# Patient Record
Sex: Male | Born: 1950 | Race: White | Hispanic: No | Marital: Single | Smoking: Current every day smoker
Health system: Southern US, Community
[De-identification: ages and names within clinical notes are randomized; demographics above are authoritative.]

## PROBLEM LIST (undated history)

## (undated) DIAGNOSIS — I2510 Atherosclerotic heart disease of native coronary artery without angina pectoris: Secondary | ICD-10-CM

## (undated) DIAGNOSIS — I739 Peripheral vascular disease, unspecified: Secondary | ICD-10-CM

## (undated) DIAGNOSIS — G709 Myoneural disorder, unspecified: Secondary | ICD-10-CM

## (undated) DIAGNOSIS — F419 Anxiety disorder, unspecified: Secondary | ICD-10-CM

## (undated) DIAGNOSIS — K219 Gastro-esophageal reflux disease without esophagitis: Secondary | ICD-10-CM

## (undated) HISTORY — DX: Peripheral vascular disease, unspecified: I73.9

## (undated) HISTORY — PX: CORONARY ANGIOPLASTY WITH STENT PLACEMENT: SHX49

## (undated) HISTORY — PX: APPENDECTOMY: SHX54

---

## 2015-07-14 ENCOUNTER — Encounter: Payer: Self-pay | Admitting: Podiatry

## 2015-07-14 ENCOUNTER — Ambulatory Visit (INDEPENDENT_AMBULATORY_CARE_PROVIDER_SITE_OTHER): Payer: BC Managed Care – PPO | Admitting: Podiatry

## 2015-07-14 ENCOUNTER — Telehealth: Payer: Self-pay | Admitting: Podiatry

## 2015-07-14 VITALS — BP 149/83 | HR 89 | Resp 14

## 2015-07-14 DIAGNOSIS — R52 Pain, unspecified: Secondary | ICD-10-CM

## 2015-07-14 DIAGNOSIS — R202 Paresthesia of skin: Secondary | ICD-10-CM | POA: Diagnosis not present

## 2015-07-14 DIAGNOSIS — I739 Peripheral vascular disease, unspecified: Secondary | ICD-10-CM

## 2015-07-14 DIAGNOSIS — R200 Anesthesia of skin: Secondary | ICD-10-CM

## 2015-07-14 NOTE — Progress Notes (Signed)
   Subjective:    Patient ID: Alfred [place], male    DOB: 05-31-1950, 65 y.o.   MRN: [id number]  HPI this patient presents the office stating that he experiences numbness and pain in his feet and legs  while he is sleeping. He says that in the last 4 weeks. This is been waking him. He does admit to mild numbness in the morning in both feet with left greater than right foot. He experiences significant coldness in his feet frequently. He states this condition is worsening over time. He does admit to having heart surgery for which he had stents inserted in his heart. He presents the office for an evaluation of his feet to help explain the numbness through his feet and the sharp shooting pain through his lower leg. Finally, upon valuation. He was asked about claudication. He says he would have pain walking from this office over to the hospital, which is not a very far distance The patient presents here today with B/L feet that are numb and tingling with the left worse since 1 month.  Review of Systems  Musculoskeletal:       Muscle pains  All other systems reviewed and are negative.      Objective:   Physical Exam GENERAL APPEARANCE: Alert, conversant. Appropriately groomed. No acute distress.  VASCULAR: Pedal pulses are not   [facility]pable [facility].  Capillary refill time is 3 seconds.,  Cold feet noted but hair is present on dorsum of both feet.  . The toes are cold with purplish hue noted.  NEUROLOGIC: sensation is normal to 5.07 monofilament at 5/5 sites bilateral.  Light touch is intact bilateral, Muscle strength normal.  MUSCULOSKELETAL: acceptable muscle strength, tone and stability bilateral.  Intrinsic muscluature intact bilateral.  Rectus appearance of foot and digits noted bilateral.   DERMATOLOGIC: skin color, texture, and turgor are within normal limits.  No preulcerative lesions or ulcers  are seen, no interdigital maceration noted.  No open lesions present.  Digital nails are  asymptomatic. No drainage noted.         Assessment & Plan:  Numbness secondary to vascular  IE  Evaluation was performed and I blieve he needs to be examined by vascular specialist.  RTC after his vascular appointment  Prn   Helane GuntherGregory Trason Shifflet DPM

## 2015-07-15 ENCOUNTER — Telehealth: Payer: Self-pay | Admitting: *Deleted

## 2015-07-15 DIAGNOSIS — R0989 Other specified symptoms and signs involving the circulatory and respiratory systems: Secondary | ICD-10-CM

## 2015-07-15 NOTE — Telephone Encounter (Deleted)
-----   Message from Geoffry Paradiseheryl M Martin sent at 07/14/2015  3:38 PM EDT ----- Dr. Stacie AcresMayer referred this patient to a [place]ascular Sur[place] in [place].  I scheduled the appointment for Tues., July 22, 2015 at 2 pm with the patient to follow-up with Dr. Stacie AcresMayer after his appointment with the specialist.  He is seeing Terrilee FilesMichael Lininger, MD [address], [place], [place] [address] [phone].

## 2015-07-15 NOTE — Telephone Encounter (Signed)
Entered in error

## 2015-07-15 NOTE — Telephone Encounter (Addendum)
-----   Message from Geoffry Paradiseheryl M Martin sent at 07/14/2015  3:38 PM EDT ----- Dr. Stacie AcresMayer referred this patient to a [place]ascular Sur[place].  I scheduled the appointment for Tues., July 22, 2015 at 2 pm with the patient to follow-up with Dr. Stacie AcresMayer after his appointment with the specialist.  He is seeing Terrilee FilesMichael Lininger, MD [address], [place], [place] [address] [phone].  Fax[facility] medical records and [facility] [phone], fax [phone].

## 2015-08-06 ENCOUNTER — Other Ambulatory Visit: Payer: Self-pay

## 2015-08-06 DIAGNOSIS — I739 Peripheral vascular disease, unspecified: Secondary | ICD-10-CM

## 2015-08-07 ENCOUNTER — Encounter: Payer: Self-pay | Admitting: Vascular Surgery

## 2015-08-12 ENCOUNTER — Encounter: Payer: Self-pay | Admitting: Vascular Surgery

## 2015-08-12 ENCOUNTER — Ambulatory Visit (HOSPITAL_COMMUNITY)
Admission: RE | Admit: 2015-08-12 | Discharge: 2015-08-12 | Disposition: A | Discharge status: Home / Self Care | Payer: Medicare Other | Source: Ambulatory Visit | Attending: Vascular Surgery | Admitting: Vascular Surgery

## 2015-08-12 ENCOUNTER — Ambulatory Visit (INDEPENDENT_AMBULATORY_CARE_PROVIDER_SITE_OTHER): Payer: BC Managed Care – PPO | Admitting: Vascular Surgery

## 2015-08-12 ENCOUNTER — Other Ambulatory Visit: Payer: Self-pay

## 2015-08-12 VITALS — BP 148/80 | HR 70 | Temp 97.0°F | Resp 16 | Ht 68.0 in | Wt 172.0 lb

## 2015-08-12 DIAGNOSIS — I7409 Other arterial embolism and thrombosis of abdominal aorta: Secondary | ICD-10-CM

## 2015-08-12 DIAGNOSIS — I739 Peripheral vascular disease, unspecified: Secondary | ICD-10-CM | POA: Insufficient documentation

## 2015-08-12 DIAGNOSIS — R0989 Other specified symptoms and signs involving the circulatory and respiratory systems: Secondary | ICD-10-CM | POA: Diagnosis present

## 2015-08-12 NOTE — Progress Notes (Signed)
[facility] and [facility] Specialist of [place]  Patient name: Alfred [place] MRN: [id number] DOB: 1950-10-02 Sex: male  REASON FOR CONSULT: Patient seen today for severe lower from the arterial insufficiency. He is a very pleasant 65 year old gentleman has had progressive symptoms of pain and numbness in both lower extremities. He reports that crap calf cramping with minimal walking and also reports that he has been unable to sleep due to his left foot waking him up at night. He reports that he is able to walk a short distance have some relief going to back to bed view immediately awakened again with severe pain. Fortunately has had no tissue loss.  Not have any history of prior arterial treatment. No history of stroke. Does have a history of prior coronary stenting  Past Medical History  Diagnosis Date  . Peripheral arterial disease (HCC)     Family History  Problem Relation Age of Onset  . Heart disease Mother   . Heart disease Father     SOCIAL HISTORY: Social History   Social History  . Marital Status: Single    Spouse Name: N/A  . Number of Children: N/A  . Years of Education: N/A   Occupational History  . Not on file.   Social History Main Topics  . Smoking status: Current Every Day Smoker -- 1.00 packs/day    Types: Cigarettes  . Smokeless tobacco: Not on file  . Alcohol Use: No  . Drug Use: No  . Sexual Activity: Not on file   Other Topics Concern  . Not on file   Social History Narrative    Allergies  Allergen Reactions  . Codeine     Current Outpatient Prescriptions  Medication Sig Dispense Refill  . aspirin 81 MG tablet Take 81 mg by mouth daily.     No current facility-administered medications for this visit.    REVIEW OF SYSTEMS:  [X]  denotes positive finding, [ ]  denotes negative finding Cardiac  Comments:  Chest pain or chest pressure:    Shortness of breath upon exertion:    Short of breath when lying flat:    Irregular heart rhythm:      Vascular    Pain in calf, thigh, or hip brought on by ambulation: x   Pain in feet at night that wakes you up from your sleep:  x   Blood clot in your veins:    Leg swelling:         Pulmonary    Oxygen at home:    Productive cough:     Wheezing:         Neurologic    Sudden weakness in arms or legs:     Sudden numbness in arms or legs:     Sudden onset of difficulty speaking or slurred speech:    Temporary loss of vision in one eye:     Problems with dizziness:         Gastrointestinal    Blood in stool:     Vomited blood:         Genitourinary    Burning when urinating:     Blood in urine:        Psychiatric    Major depression:         Hematologic    Bleeding problems:    Problems with blood clotting too easily:        Skin    Rashes or ulcers:        Constitutional  Fever or chills:      PHYSICAL EXAM: Filed Vitals:   08/12/15 1102 08/12/15 1108  BP: 150/88 148/80  Pulse: 70 70  Temp: 97 F (36.1 C)   TempSrc: Oral   Resp: 16   Height: 5' 8" (1.727 m)   Weight: 172 lb (78.019 kg)   SpO2: 96%     GENERAL: The patient is a well-nourished male, in no acute distress. The vital signs are documented above. CARDIAC: There is a regular rate and rhythm.  VASCULAR: 2+ radial pulses bilaterally. Absent femoral popliteal and distal pulses bilaterally PULMONARY: There is good air exchange bilaterally without wheezing or rales. ABDOMEN: Soft and non-tender with normal pitched bowel sounds. No bruits noted MUSCULOSKELETAL: There are no major deformities or cyanosis. NEUROLOGIC: No focal weakness or paresthesias are detected. SKIN: There are no ulcers or rashes noted. Dependent rubor noted in his left foot. PSYCHIATRIC: The patient has a normal affect.  DATA:  Ankle arm index 0.35 on the right and 0.25 on the left  I did review his CT angiogram from [place] [facility] from 07/28/2015. This reveals narrowing and irregularity below the level the renals. 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 There is reconstitution of the common femoral arteries with runoff in the superficial femoral and profundus femoris artery. He does appear to have occlusion in his left superficial femoral artery as well.  MEDICAL ISSUES: Had long discussion with the patient and his girlfriend present. Explain the cause of his claudication and rest pain. Explain the treatment option of aortobifemoral bypass. Splane the magnitude of the procedure with expected 1-2 day intensive care stay and 5-7 day hospitalization. Also explain the prolonged recovery this magnitude of surgery. Will obtain cardiac evaluation prior to surgery and schedule surgery immediately following this. Explained that he is at risk for limb loss due to this level of ischemia.   Pearlee Arvizu [facility] [place] Beeper: [phone]      

## 2015-08-13 ENCOUNTER — Telehealth: Payer: Self-pay | Admitting: Vascular Surgery

## 2015-08-13 NOTE — Telephone Encounter (Signed)
Received fax of appointment with Dr. Dulce SellarMunley for cardiac clearance  - 08/20/15 8:30 with Dr. Rush BarerWaumeyer. Said pt is aware of appointment - kf

## 2015-08-25 ENCOUNTER — Encounter (HOSPITAL_COMMUNITY): Payer: Self-pay

## 2015-08-25 ENCOUNTER — Encounter (HOSPITAL_COMMUNITY)
Admission: RE | Admit: 2015-08-25 | Discharge: 2015-08-25 | Disposition: A | Discharge status: Home / Self Care | Payer: Medicare Other | Source: Ambulatory Visit | Attending: Vascular Surgery | Admitting: Vascular Surgery

## 2015-08-25 DIAGNOSIS — Z0183 Encounter for blood typing: Secondary | ICD-10-CM | POA: Insufficient documentation

## 2015-08-25 DIAGNOSIS — I2510 Atherosclerotic heart disease of native coronary artery without angina pectoris: Secondary | ICD-10-CM | POA: Diagnosis not present

## 2015-08-25 DIAGNOSIS — I739 Peripheral vascular disease, unspecified: Secondary | ICD-10-CM | POA: Diagnosis not present

## 2015-08-25 DIAGNOSIS — F17200 Nicotine dependence, unspecified, uncomplicated: Secondary | ICD-10-CM | POA: Diagnosis not present

## 2015-08-25 DIAGNOSIS — Z955 Presence of coronary angioplasty implant and graft: Secondary | ICD-10-CM | POA: Insufficient documentation

## 2015-08-25 DIAGNOSIS — K219 Gastro-esophageal reflux disease without esophagitis: Secondary | ICD-10-CM | POA: Diagnosis not present

## 2015-08-25 DIAGNOSIS — Z01812 Encounter for preprocedural laboratory examination: Secondary | ICD-10-CM | POA: Diagnosis not present

## 2015-08-25 DIAGNOSIS — Z7982 Long term (current) use of aspirin: Secondary | ICD-10-CM | POA: Diagnosis not present

## 2015-08-25 HISTORY — DX: Anxiety disorder, unspecified: F41.9

## 2015-08-25 HISTORY — DX: Myoneural disorder, unspecified: G70.9

## 2015-08-25 HISTORY — DX: Atherosclerotic heart disease of native coronary artery without angina pectoris: I25.10

## 2015-08-25 HISTORY — DX: Gastro-esophageal reflux disease without esophagitis: K21.9

## 2015-08-25 LAB — URINE MICROSCOPIC-ADD ON: Squamous Epithelial / LPF: NONE SEEN

## 2015-08-25 LAB — SURGICAL PCR SCREEN
MRSA, PCR: NEGATIVE
Staphylococcus aureus: NEGATIVE

## 2015-08-25 LAB — CBC
HEMATOCRIT: 49.3 % (ref 39.0–52.0)
HEMOGLOBIN: 16.8 g/dL (ref 13.0–17.0)
MCH: 31.5 pg (ref 26.0–34.0)
MCHC: 34.1 g/dL (ref 30.0–36.0)
MCV: 92.3 fL (ref 78.0–100.0)
PLATELETS: 179 10*3/uL (ref 150–400)
RBC: 5.34 MIL/uL (ref 4.22–5.81)
RDW: 12.4 % (ref 11.5–15.5)
WBC: 8.8 10*3/uL (ref 4.0–10.5)

## 2015-08-25 LAB — URINALYSIS, ROUTINE W REFLEX MICROSCOPIC
Bilirubin Urine: NEGATIVE
GLUCOSE, UA: 500 mg/dL — AB
Ketones, ur: NEGATIVE mg/dL
Leukocytes, UA: NEGATIVE
Nitrite: NEGATIVE
Protein, ur: NEGATIVE mg/dL
SPECIFIC GRAVITY, URINE: 1.021 (ref 1.005–1.030)
pH: 6 (ref 5.0–8.0)

## 2015-08-25 LAB — COMPREHENSIVE METABOLIC PANEL
ALK PHOS: 79 U/L (ref 38–126)
ALT: 23 U/L (ref 17–63)
ANION GAP: 9 (ref 5–15)
AST: 16 U/L (ref 15–41)
Albumin: 3.9 g/dL (ref 3.5–5.0)
BUN: 18 mg/dL (ref 6–20)
CALCIUM: 9.4 mg/dL (ref 8.9–10.3)
CO2: 23 mmol/L (ref 22–32)
Chloride: 106 mmol/L (ref 101–111)
Creatinine, Ser: 0.95 mg/dL (ref 0.61–1.24)
GFR calc non Af Amer: >60 mL/min (ref >60)
Glucose, Bld: 94 mg/dL (ref 65–99)
POTASSIUM: 4.1 mmol/L (ref 3.5–5.1)
SODIUM: 138 mmol/L (ref 135–145)
Total Bilirubin: 0.6 mg/dL (ref 0.3–1.2)
Total Protein: 6.6 g/dL (ref 6.5–8.1)

## 2015-08-25 LAB — TYPE AND SCREEN
ABO/RH(D): B NEG
ANTIBODY SCREEN: NEGATIVE

## 2015-08-25 LAB — PROTIME-INR
INR: 0.95 (ref 0.00–1.49)
Prothrombin Time: 12.9 seconds (ref 11.6–15.2)

## 2015-08-25 LAB — APTT: APTT: 24 s (ref 24–37)

## 2015-08-25 LAB — ABO/RH: ABO/RH(D): B NEG

## 2015-08-25 NOTE — Progress Notes (Signed)
Requested last office visit and any recent cardiac studies from [facility] in [place].

## 2015-08-25 NOTE — Progress Notes (Signed)
To have Stress Test done on 08-28-2015 At [facility] [place].

## 2015-08-25 NOTE — Pre-Procedure Instructions (Signed)
    Carroll SageRandy Piccininni  08/25/2015      [id number], [place] - [address] AT [facility] [address] [place] [address] Phone: [phone] Fax: [phone]    Your procedure is scheduled on May  22,2017   Monday .  [facility] Admitting at 5:30 A.M.   Call this number if you have problems the morning of surgery:  [phone]   Remember:  Do not eat food or drink liquids after midnight.   Take these medicines the morning of surgery with A SIP OF WATER none   Do not wear jewelry,  Do not wear lotions, powders, or perfumes.  You may NOT wear deodorant.   Do not shave 48 hours prior to surgery.  Men may shave face and neck.   Do not bring valuables to the hospital.  [facility] is not responsible for any belongings or valuables.  Contacts, dentures or bridgework may not be worn into surgery.  Leave your suitcase in the car.  After surgery it may be brought to your room.  For patients admitted to the hospital, discharge time will be determined by your treatment team.  Patients discharged the day of surgery will not be allowed to drive home.     Special instructions:  See Attached Sheet for instructions on CHG Shower  Please read over the following fact sheets that you were given. Pain Booklet, Coughing and Deep Breathing and Blood Transfusion Information

## 2015-08-25 NOTE — Progress Notes (Addendum)
Respiratory states that they did not receive the blood gases on pt. Which were drawn @ 1330. Will need to redraw DOS. Possibility Blood gases never drawn,every one from lab has left for the day,unable to check with them.

## 2015-08-26 NOTE — Progress Notes (Addendum)
Anesthesia Chart Review:  Pt is a 65 year old male scheduled for aortobifemoral bypass graft on 09/01/2015 with Dr. Arbie Holmes.   PMH includes:  CAD (3 stents), PAD, GERD. Current smoker. BMI 27  Medications include: ASA, recently started on pravastatin.   Preoperative labs reviewed.  Blood gas will be obtained DOS.   EKG 08/20/15: requested from [facility] in [place].   Pt saw Dr. Wille Holmes for pre-op eval.  Pt is scheduled for stress test 08/28/15. Will revisit chart when results available.   Alfred Mastngela Alexiah Koroma, FNP-BC [facility]/Anesthesiology Phone: [phone] 08/26/2015 2:01 PM  Addendum:  Nuclear stress test 08/28/15: negative study.   If no changes, I anticipate pt can proceed with surgery as scheduled.   Alfred Mastngela Osamah Schmader, FNP-BC [facility]/Anesthesiology Phone: [phone] 08/29/2015 4:14 PM

## 2015-08-29 ENCOUNTER — Encounter: Payer: Self-pay | Admitting: Vascular Surgery

## 2015-08-31 MED ORDER — DEXTROSE 5 % IV SOLN
1.5000 g | INTRAVENOUS | Status: AC
  Administered 2015-09-01 (×2): 1.5 g via INTRAVENOUS
  Filled 2015-08-31: qty 1.5

## 2015-08-31 MED ORDER — SODIUM CHLORIDE 0.9 % IV SOLN
INTRAVENOUS | Status: DC
  Administered 2015-09-01: 12:00:00 via INTRAVENOUS

## 2015-09-01 ENCOUNTER — Inpatient Hospital Stay (HOSPITAL_COMMUNITY): Payer: Medicare Other

## 2015-09-01 ENCOUNTER — Inpatient Hospital Stay (HOSPITAL_COMMUNITY)
Admission: RE | Admit: 2015-09-01 | Discharge: 2015-09-08 | DRG: 269 | Disposition: A | Discharge status: Home Health | Payer: Medicare Other | Source: Ambulatory Visit | Attending: Vascular Surgery | Admitting: Vascular Surgery

## 2015-09-01 ENCOUNTER — Encounter (HOSPITAL_COMMUNITY)
Admission: RE | Disposition: A | Discharge status: Home Health | Payer: Self-pay | Source: Ambulatory Visit | Attending: Vascular Surgery

## 2015-09-01 ENCOUNTER — Encounter (HOSPITAL_COMMUNITY): Payer: Self-pay | Admitting: *Deleted

## 2015-09-01 ENCOUNTER — Inpatient Hospital Stay (HOSPITAL_COMMUNITY): Payer: Medicare Other | Admitting: Emergency Medicine

## 2015-09-01 ENCOUNTER — Inpatient Hospital Stay (HOSPITAL_COMMUNITY): Payer: Medicare Other | Admitting: Certified Registered Nurse Anesthetist

## 2015-09-01 DIAGNOSIS — F17210 Nicotine dependence, cigarettes, uncomplicated: Secondary | ICD-10-CM | POA: Diagnosis present

## 2015-09-01 DIAGNOSIS — Z7982 Long term (current) use of aspirin: Secondary | ICD-10-CM | POA: Diagnosis not present

## 2015-09-01 DIAGNOSIS — K219 Gastro-esophageal reflux disease without esophagitis: Secondary | ICD-10-CM | POA: Diagnosis present

## 2015-09-01 DIAGNOSIS — I10 Essential (primary) hypertension: Secondary | ICD-10-CM | POA: Diagnosis present

## 2015-09-01 DIAGNOSIS — I771 Stricture of artery: Principal | ICD-10-CM | POA: Diagnosis present

## 2015-09-01 DIAGNOSIS — I252 Old myocardial infarction: Secondary | ICD-10-CM | POA: Diagnosis not present

## 2015-09-01 DIAGNOSIS — Z955 Presence of coronary angioplasty implant and graft: Secondary | ICD-10-CM | POA: Diagnosis not present

## 2015-09-01 DIAGNOSIS — I7092 Chronic total occlusion of artery of the extremities: Secondary | ICD-10-CM | POA: Diagnosis present

## 2015-09-01 DIAGNOSIS — I700 Atherosclerosis of aorta: Secondary | ICD-10-CM | POA: Diagnosis present

## 2015-09-01 DIAGNOSIS — I739 Peripheral vascular disease, unspecified: Secondary | ICD-10-CM | POA: Diagnosis present

## 2015-09-01 DIAGNOSIS — F419 Anxiety disorder, unspecified: Secondary | ICD-10-CM | POA: Diagnosis present

## 2015-09-01 DIAGNOSIS — I998 Other disorder of circulatory system: Secondary | ICD-10-CM

## 2015-09-01 DIAGNOSIS — I7409 Other arterial embolism and thrombosis of abdominal aorta: Secondary | ICD-10-CM | POA: Diagnosis present

## 2015-09-01 DIAGNOSIS — Z95828 Presence of other vascular implants and grafts: Secondary | ICD-10-CM

## 2015-09-01 DIAGNOSIS — R000 Tachycardia, unspecified: Secondary | ICD-10-CM | POA: Diagnosis not present

## 2015-09-01 DIAGNOSIS — Z885 Allergy status to narcotic agent status: Secondary | ICD-10-CM | POA: Diagnosis not present

## 2015-09-01 DIAGNOSIS — Z8249 Family history of ischemic heart disease and other diseases of the circulatory system: Secondary | ICD-10-CM | POA: Diagnosis not present

## 2015-09-01 DIAGNOSIS — K429 Umbilical hernia without obstruction or gangrene: Secondary | ICD-10-CM | POA: Diagnosis present

## 2015-09-01 DIAGNOSIS — R200 Anesthesia of skin: Secondary | ICD-10-CM | POA: Diagnosis present

## 2015-09-01 HISTORY — PX: AORTA - BILATERAL FEMORAL ARTERY BYPASS GRAFT: SHX1175

## 2015-09-01 LAB — BLOOD GAS, ARTERIAL
Acid-base deficit: 1.3 mmol/L (ref 0.0–2.0)
BICARBONATE: 23.4 meq/L (ref 20.0–24.0)
O2 Content: 4 L/min
O2 SAT: 89.6 %
PATIENT TEMPERATURE: 98.1
PCO2 ART: 42.1 mmHg (ref 35.0–45.0)
PO2 ART: 58.7 mmHg — AB (ref 80.0–100.0)
TCO2: 24.7 mmol/L (ref 0–100)
pH, Arterial: 7.362 (ref 7.350–7.450)

## 2015-09-01 LAB — APTT: APTT: 23 s — AB (ref 24–37)

## 2015-09-01 LAB — BASIC METABOLIC PANEL
ANION GAP: 8 (ref 5–15)
BUN: 10 mg/dL (ref 6–20)
CALCIUM: 8.5 mg/dL — AB (ref 8.9–10.3)
CO2: 24 mmol/L (ref 22–32)
Chloride: 105 mmol/L (ref 101–111)
Creatinine, Ser: 0.94 mg/dL (ref 0.61–1.24)
GFR calc Af Amer: >60 mL/min (ref >60)
Glucose, Bld: 210 mg/dL — ABNORMAL HIGH (ref 65–99)
POTASSIUM: 3.6 mmol/L (ref 3.5–5.1)
SODIUM: 137 mmol/L (ref 135–145)

## 2015-09-01 LAB — POCT I-STAT 7, (LYTES, BLD GAS, ICA,H+H)
Acid-base deficit: 2 mmol/L (ref 0.0–2.0)
BICARBONATE: 22.9 meq/L (ref 20.0–24.0)
CALCIUM ION: 1.15 mmol/L (ref 1.13–1.30)
HCT: 46 % (ref 39.0–52.0)
Hemoglobin: 15.6 g/dL (ref 13.0–17.0)
O2 Saturation: 97 %
PCO2 ART: 39.9 mmHg (ref 35.0–45.0)
PO2 ART: 97 mmHg (ref 80.0–100.0)
Potassium: 3.4 mmol/L — ABNORMAL LOW (ref 3.5–5.1)
Sodium: 142 mmol/L (ref 135–145)
TCO2: 24 mmol/L (ref 0–100)
pH, Arterial: 7.368 (ref 7.350–7.450)

## 2015-09-01 LAB — PROTIME-INR
INR: 1.18 (ref 0.00–1.49)
Prothrombin Time: 15.1 seconds (ref 11.6–15.2)

## 2015-09-01 LAB — CBC
HCT: 40.7 % (ref 39.0–52.0)
Hemoglobin: 13.8 g/dL (ref 13.0–17.0)
MCH: 31.3 pg (ref 26.0–34.0)
MCHC: 33.9 g/dL (ref 30.0–36.0)
MCV: 92.3 fL (ref 78.0–100.0)
PLATELETS: 191 10*3/uL (ref 150–400)
RBC: 4.41 MIL/uL (ref 4.22–5.81)
RDW: 12.4 % (ref 11.5–15.5)
WBC: 19.5 10*3/uL — AB (ref 4.0–10.5)

## 2015-09-01 LAB — GLUCOSE, CAPILLARY
GLUCOSE-CAPILLARY: 192 mg/dL — AB (ref 65–99)
GLUCOSE-CAPILLARY: 207 mg/dL — AB (ref 65–99)
GLUCOSE-CAPILLARY: 236 mg/dL — AB (ref 65–99)

## 2015-09-01 LAB — CREATININE, SERUM
CREATININE: 0.79 mg/dL (ref 0.61–1.24)
GFR calc Af Amer: >60 mL/min (ref >60)
GFR calc non Af Amer: >60 mL/min (ref >60)

## 2015-09-01 LAB — MAGNESIUM: MAGNESIUM: 1.3 mg/dL — AB (ref 1.7–2.4)

## 2015-09-01 SURGERY — CREATION, BYPASS, ARTERIAL, AORTA TO FEMORAL, BILATERAL, USING GRAFT
Anesthesia: General | Site: Abdomen | Laterality: Bilateral

## 2015-09-01 MED ORDER — HYDRALAZINE HCL 20 MG/ML IJ SOLN
5.0000 mg | INTRAMUSCULAR | Status: AC | PRN
  Administered 2015-09-01 (×2): 5 mg via INTRAVENOUS
  Filled 2015-09-01 (×2): qty 1

## 2015-09-01 MED ORDER — PROTAMINE SULFATE 10 MG/ML IV SOLN
INTRAVENOUS | Status: DC | PRN
  Administered 2015-09-01: 10 mg via INTRAVENOUS
  Administered 2015-09-01 (×2): 15 mg via INTRAVENOUS
  Administered 2015-09-01: 10 mg via INTRAVENOUS

## 2015-09-01 MED ORDER — SUGAMMADEX SODIUM 200 MG/2ML IV SOLN
INTRAVENOUS | Status: DC | PRN
  Administered 2015-09-01: 200 mg via INTRAVENOUS

## 2015-09-01 MED ORDER — CEFUROXIME SODIUM 1.5 G IJ SOLR
1.5000 g | Freq: Two times a day (BID) | INTRAMUSCULAR | Status: AC
  Administered 2015-09-02 (×2): 1.5 g via INTRAVENOUS
  Filled 2015-09-01 (×2): qty 1.5

## 2015-09-01 MED ORDER — POTASSIUM CHLORIDE CRYS ER 20 MEQ PO TBCR: 20.0000 meq | EXTENDED_RELEASE_TABLET | Freq: Every day | ORAL | Status: DC | PRN

## 2015-09-01 MED ORDER — ALBUMIN HUMAN 5 % IV SOLN
INTRAVENOUS | Status: DC | PRN
  Administered 2015-09-01 (×2): via INTRAVENOUS

## 2015-09-01 MED ORDER — ALUM & MAG HYDROXIDE-SIMETH 200-200-20 MG/5ML PO SUSP: 15.0000 mL | ORAL | Status: DC | PRN

## 2015-09-01 MED ORDER — LACTATED RINGERS IV SOLN
INTRAVENOUS | Status: DC | PRN
  Administered 2015-09-01 (×3): via INTRAVENOUS

## 2015-09-01 MED ORDER — PHENYLEPHRINE HCL 10 MG/ML IJ SOLN
10.0000 mg | INTRAVENOUS | Status: DC | PRN
  Administered 2015-09-01: 40 ug/min via INTRAVENOUS

## 2015-09-01 MED ORDER — MIDAZOLAM HCL 2 MG/2ML IJ SOLN
INTRAMUSCULAR | Status: AC
  Filled 2015-09-01: qty 2

## 2015-09-01 MED ORDER — ROCURONIUM BROMIDE 50 MG/5ML IV SOLN
INTRAVENOUS | Status: AC
  Filled 2015-09-01: qty 3

## 2015-09-01 MED ORDER — MANNITOL 25 % IV SOLN
25.0000 g | INTRAVENOUS | Status: DC
  Filled 2015-09-01: qty 100

## 2015-09-01 MED ORDER — PHENOL 1.4 % MT LIQD: 1.0000 | OROMUCOSAL | Status: DC | PRN

## 2015-09-01 MED ORDER — CHLORHEXIDINE GLUCONATE CLOTH 2 % EX PADS: 6.0000 | MEDICATED_PAD | Freq: Once | CUTANEOUS | Status: DC

## 2015-09-01 MED ORDER — METOPROLOL TARTRATE 5 MG/5ML IV SOLN: 2.0000 mg | INTRAVENOUS | Status: DC | PRN

## 2015-09-01 MED ORDER — DOCUSATE SODIUM 100 MG PO CAPS
100.0000 mg | ORAL_CAPSULE | Freq: Every day | ORAL | Status: DC
  Administered 2015-09-04 – 2015-09-07 (×3): 100 mg via ORAL
  Filled 2015-09-01 (×5): qty 1

## 2015-09-01 MED ORDER — MANNITOL 25 % IV SOLN
INTRAVENOUS | Status: DC | PRN
  Administered 2015-09-01 (×2): 12.5 g via INTRAVENOUS

## 2015-09-01 MED ORDER — INSULIN ASPART 100 UNIT/ML ~~LOC~~ SOLN
0.0000 [IU] | SUBCUTANEOUS | Status: DC
  Administered 2015-09-01 (×2): 3 [IU] via SUBCUTANEOUS
  Administered 2015-09-02: 2 [IU] via SUBCUTANEOUS
  Administered 2015-09-02 (×4): 1 [IU] via SUBCUTANEOUS
  Administered 2015-09-03 (×2): 2 [IU] via SUBCUTANEOUS
  Administered 2015-09-03 – 2015-09-04 (×6): 1 [IU] via SUBCUTANEOUS
  Administered 2015-09-04: 2 [IU] via SUBCUTANEOUS
  Administered 2015-09-04 – 2015-09-08 (×8): 1 [IU] via SUBCUTANEOUS

## 2015-09-01 MED ORDER — GUAIFENESIN-DM 100-10 MG/5ML PO SYRP
15.0000 mL | ORAL_SOLUTION | ORAL | Status: DC | PRN
  Administered 2015-09-05 – 2015-09-06 (×2): 15 mL via ORAL
  Filled 2015-09-01 (×3): qty 15

## 2015-09-01 MED ORDER — HEPARIN SODIUM (PORCINE) 1000 UNIT/ML IJ SOLN
INTRAMUSCULAR | Status: DC | PRN
  Administered 2015-09-01: 8 mL via INTRAVENOUS

## 2015-09-01 MED ORDER — FENTANYL CITRATE (PF) 250 MCG/5ML IJ SOLN
INTRAMUSCULAR | Status: AC
  Filled 2015-09-01: qty 5

## 2015-09-01 MED ORDER — SODIUM CHLORIDE 0.9 % IV SOLN
INTRAVENOUS | Status: DC | PRN
  Administered 2015-09-01: 500 mL

## 2015-09-01 MED ORDER — EPHEDRINE 5 MG/ML INJ
INTRAVENOUS | Status: AC
  Filled 2015-09-01: qty 10

## 2015-09-01 MED ORDER — PROTAMINE SULFATE 10 MG/ML IV SOLN
INTRAVENOUS | Status: AC
  Filled 2015-09-01: qty 5

## 2015-09-01 MED ORDER — PANTOPRAZOLE SODIUM 40 MG IV SOLR
40.0000 mg | Freq: Every day | INTRAVENOUS | Status: DC
  Administered 2015-09-01 – 2015-09-04 (×4): 40 mg via INTRAVENOUS
  Filled 2015-09-01 (×3): qty 40

## 2015-09-01 MED ORDER — 0.9 % SODIUM CHLORIDE (POUR BTL) OPTIME
TOPICAL | Status: DC | PRN
  Administered 2015-09-01: 3000 mL

## 2015-09-01 MED ORDER — KCL IN DEXTROSE-NACL 20-5-0.45 MEQ/L-%-% IV SOLN
INTRAVENOUS | Status: DC
  Administered 2015-09-01: 15:00:00 via INTRAVENOUS
  Administered 2015-09-02: 125 mL via INTRAVENOUS
  Administered 2015-09-02 – 2015-09-06 (×5): via INTRAVENOUS
  Filled 2015-09-01 (×16): qty 1000

## 2015-09-01 MED ORDER — FENTANYL CITRATE (PF) 100 MCG/2ML IJ SOLN
INTRAMUSCULAR | Status: DC | PRN
  Administered 2015-09-01: 50 ug via INTRAVENOUS
  Administered 2015-09-01: 150 ug via INTRAVENOUS
  Administered 2015-09-01 (×4): 50 ug via INTRAVENOUS
  Administered 2015-09-01: 250 ug via INTRAVENOUS
  Administered 2015-09-01 (×5): 50 ug via INTRAVENOUS

## 2015-09-01 MED ORDER — LACTATED RINGERS IV SOLN
INTRAVENOUS | Status: DC | PRN
  Administered 2015-09-01: 07:00:00 via INTRAVENOUS

## 2015-09-01 MED ORDER — SODIUM CHLORIDE 0.9 % IV SOLN: 500.0000 mL | Freq: Once | INTRAVENOUS | Status: DC | PRN

## 2015-09-01 MED ORDER — ROCURONIUM BROMIDE 100 MG/10ML IV SOLN
INTRAVENOUS | Status: DC | PRN
  Administered 2015-09-01: 50 mg via INTRAVENOUS
  Administered 2015-09-01: 20 mg via INTRAVENOUS
  Administered 2015-09-01: 5 mg via INTRAVENOUS
  Administered 2015-09-01: 20 mg via INTRAVENOUS
  Administered 2015-09-01: 10 mg via INTRAVENOUS

## 2015-09-01 MED ORDER — PROPOFOL 10 MG/ML IV BOLUS
INTRAVENOUS | Status: DC | PRN
  Administered 2015-09-01: 80 mg via INTRAVENOUS

## 2015-09-01 MED ORDER — ACETAMINOPHEN 325 MG PO TABS
325.0000 mg | ORAL_TABLET | ORAL | Status: DC | PRN
  Administered 2015-09-06 – 2015-09-08 (×6): 650 mg via ORAL
  Filled 2015-09-01 (×7): qty 2

## 2015-09-01 MED ORDER — ONDANSETRON HCL 4 MG/2ML IJ SOLN
INTRAMUSCULAR | Status: DC | PRN
  Administered 2015-09-01: 4 mg via INTRAVENOUS

## 2015-09-01 MED ORDER — LIDOCAINE HCL (CARDIAC) 20 MG/ML IV SOLN
INTRAVENOUS | Status: DC | PRN
  Administered 2015-09-01: 100 mg via INTRAVENOUS

## 2015-09-01 MED ORDER — BISACODYL 10 MG RE SUPP
10.0000 mg | Freq: Every day | RECTAL | Status: DC | PRN
  Administered 2015-09-04: 10 mg via RECTAL
  Filled 2015-09-01 (×2): qty 1

## 2015-09-01 MED ORDER — HYDROMORPHONE HCL 1 MG/ML IJ SOLN
0.2500 mg | INTRAMUSCULAR | Status: DC | PRN
  Administered 2015-09-01 (×2): 0.5 mg via INTRAVENOUS

## 2015-09-01 MED ORDER — LIDOCAINE 2% (20 MG/ML) 5 ML SYRINGE
INTRAMUSCULAR | Status: AC
  Filled 2015-09-01: qty 5

## 2015-09-01 MED ORDER — LABETALOL HCL 5 MG/ML IV SOLN
10.0000 mg | INTRAVENOUS | Status: DC | PRN
  Administered 2015-09-01 – 2015-09-04 (×2): 10 mg via INTRAVENOUS
  Filled 2015-09-01 (×2): qty 4

## 2015-09-01 MED ORDER — MEPERIDINE HCL 25 MG/ML IJ SOLN: 6.2500 mg | INTRAMUSCULAR | Status: DC | PRN

## 2015-09-01 MED ORDER — ONDANSETRON HCL 4 MG/2ML IJ SOLN
4.0000 mg | Freq: Four times a day (QID) | INTRAMUSCULAR | Status: DC | PRN
Start: 2015-09-01 — End: 2015-09-02
  Administered 2015-09-02: 4 mg via INTRAVENOUS
  Filled 2015-09-01: qty 2

## 2015-09-01 MED ORDER — MORPHINE SULFATE (PF) 2 MG/ML IV SOLN
2.0000 mg | INTRAVENOUS | Status: DC | PRN
  Administered 2015-09-01 (×3): 4 mg via INTRAVENOUS
  Administered 2015-09-01: 2 mg via INTRAVENOUS
  Administered 2015-09-01 – 2015-09-02 (×7): 4 mg via INTRAVENOUS
  Filled 2015-09-01 (×9): qty 2
  Filled 2015-09-01: qty 1
  Filled 2015-09-01: qty 2

## 2015-09-01 MED ORDER — ENOXAPARIN SODIUM 40 MG/0.4ML ~~LOC~~ SOLN
40.0000 mg | SUBCUTANEOUS | Status: DC
  Administered 2015-09-02 – 2015-09-07 (×6): 40 mg via SUBCUTANEOUS
  Filled 2015-09-01 (×6): qty 0.4

## 2015-09-01 MED ORDER — HYDROMORPHONE HCL 1 MG/ML IJ SOLN
INTRAMUSCULAR | Status: AC
  Filled 2015-09-01: qty 1

## 2015-09-01 MED ORDER — MIDAZOLAM HCL 5 MG/5ML IJ SOLN
INTRAMUSCULAR | Status: DC | PRN
  Administered 2015-09-01: 2 mg via INTRAVENOUS

## 2015-09-01 MED ORDER — ONDANSETRON HCL 4 MG/2ML IJ SOLN
INTRAMUSCULAR | Status: AC
  Filled 2015-09-01: qty 2

## 2015-09-01 MED ORDER — MAGNESIUM SULFATE 2 GM/50ML IV SOLN
2.0000 g | Freq: Every day | INTRAVENOUS | Status: AC | PRN
  Administered 2015-09-01: 2 g via INTRAVENOUS
  Filled 2015-09-01: qty 50

## 2015-09-01 MED ORDER — ONDANSETRON HCL 4 MG/2ML IJ SOLN: 4.0000 mg | Freq: Once | INTRAMUSCULAR | Status: DC | PRN

## 2015-09-01 MED ORDER — ACETAMINOPHEN 325 MG RE SUPP: 325.0000 mg | RECTAL | Status: DC | PRN

## 2015-09-01 MED ORDER — PROPOFOL 10 MG/ML IV BOLUS
INTRAVENOUS | Status: AC
  Filled 2015-09-01: qty 20

## 2015-09-01 MED ORDER — DEXTROSE 5 % IV SOLN
1.5000 g | INTRAVENOUS | Status: DC
  Filled 2015-09-01: qty 1.5

## 2015-09-01 SURGICAL SUPPLY — 59 items
BENZOIN TINCTURE PRP APPL 2/3 (GAUZE/BANDAGES/DRESSINGS) ×9 IMPLANT
CANISTER SUCTION 2500CC (MISCELLANEOUS) ×3 IMPLANT
CANNULA VESSEL 3MM 2 BLNT TIP (CANNULA) ×6 IMPLANT
CLIP LIGATING EXTRA MED SLVR (CLIP) ×3 IMPLANT
CLIP LIGATING EXTRA SM BLUE (MISCELLANEOUS) ×3 IMPLANT
CLOSURE STERI-STRIP 1/2X4 (GAUZE/BANDAGES/DRESSINGS) ×3
CLOSURE WOUND 1/2 X4 (GAUZE/BANDAGES/DRESSINGS) ×2
CLSR STERI-STRIP ANTIMIC 1/2X4 (GAUZE/BANDAGES/DRESSINGS) ×6 IMPLANT
COVER TABLE BACK 60X90 (DRAPES) ×3 IMPLANT
DRAPE BILATERAL SPLIT (DRAPES) IMPLANT
DRAPE CV SPLIT W-CLR ANES SCRN (DRAPES) IMPLANT
DRSG COVADERM 4X14 (GAUZE/BANDAGES/DRESSINGS) ×3 IMPLANT
DRSG COVADERM 4X6 (GAUZE/BANDAGES/DRESSINGS) ×6 IMPLANT
ELECT BLADE 4.0 EZ CLEAN MEGAD (MISCELLANEOUS) ×3
ELECT REM PT RETURN 9FT ADLT (ELECTROSURGICAL) ×3
ELECTRODE BLDE 4.0 EZ CLN MEGD (MISCELLANEOUS) ×1 IMPLANT
ELECTRODE REM PT RTRN 9FT ADLT (ELECTROSURGICAL) ×1 IMPLANT
FELT TEFLON 1X6 (MISCELLANEOUS) ×3 IMPLANT
GLOVE BIO SURGEON STRL SZ 6.5 (GLOVE) ×4 IMPLANT
GLOVE BIO SURGEONS STRL SZ 6.5 (GLOVE) ×2
GLOVE BIOGEL PI IND STRL 6.5 (GLOVE) ×5 IMPLANT
GLOVE BIOGEL PI INDICATOR 6.5 (GLOVE) ×10
GLOVE ECLIPSE 6.5 STRL STRAW (GLOVE) ×9 IMPLANT
GLOVE SS BIOGEL STRL SZ 7.5 (GLOVE) ×1 IMPLANT
GLOVE SUPERSENSE BIOGEL SZ 7.5 (GLOVE) ×2
GOWN STRL REUS W/ TWL LRG LVL3 (GOWN DISPOSABLE) ×3 IMPLANT
GOWN STRL REUS W/TWL LRG LVL3 (GOWN DISPOSABLE) ×6
GRAFT HEMASHIELD 14X8MM (Vascular Products) ×3 IMPLANT
INSERT FOGARTY 61MM (MISCELLANEOUS) ×3 IMPLANT
INSERT FOGARTY SM (MISCELLANEOUS) ×6 IMPLANT
KIT BASIN OR (CUSTOM PROCEDURE TRAY) ×3 IMPLANT
KIT ROOM TURNOVER OR (KITS) ×3 IMPLANT
NS IRRIG 1000ML POUR BTL (IV SOLUTION) ×6 IMPLANT
PACK AORTA (CUSTOM PROCEDURE TRAY) ×3 IMPLANT
PAD ARMBOARD 7.5X6 YLW CONV (MISCELLANEOUS) ×6 IMPLANT
SPONGE LAP 18X18 X RAY DECT (DISPOSABLE) IMPLANT
STAPLER VISISTAT 35W (STAPLE) ×6 IMPLANT
STRIP CLOSURE SKIN 1/2X4 (GAUZE/BANDAGES/DRESSINGS) ×4 IMPLANT
SUT ETHIBOND 5 LR DA (SUTURE) IMPLANT
SUT PDS AB 1 TP1 54 (SUTURE) ×6 IMPLANT
SUT PROLENE 3 0 SH1 36 (SUTURE) ×9 IMPLANT
SUT PROLENE 5 0 C 1 24 (SUTURE) ×6 IMPLANT
SUT PROLENE 5 0 C 1 36 (SUTURE) IMPLANT
SUT PROLENE 6 0 CC (SUTURE) ×9 IMPLANT
SUT SILK 2 0 (SUTURE) ×2
SUT SILK 2 0 SH CR/8 (SUTURE) ×3 IMPLANT
SUT SILK 2 0 TIES 17X18 (SUTURE) ×2
SUT SILK 2-0 18XBRD TIE 12 (SUTURE) ×1 IMPLANT
SUT SILK 2-0 18XBRD TIE BLK (SUTURE) ×1 IMPLANT
SUT SILK 3 0 (SUTURE) ×2
SUT SILK 3 0 TIES 17X18 (SUTURE) ×2
SUT SILK 3-0 18XBRD TIE 12 (SUTURE) ×1 IMPLANT
SUT SILK 3-0 18XBRD TIE BLK (SUTURE) ×1 IMPLANT
SUT VIC AB 2-0 CT1 36 (SUTURE) ×9 IMPLANT
SUT VIC AB 3-0 SH 27 (SUTURE) ×8
SUT VIC AB 3-0 SH 27X BRD (SUTURE) ×4 IMPLANT
TOWEL BLUE STERILE X RAY DET (MISCELLANEOUS) ×6 IMPLANT
TRAY FOLEY W/METER SILVER 16FR (SET/KITS/TRAYS/PACK) ×3 IMPLANT
WATER STERILE IRR 1000ML POUR (IV SOLUTION) ×3 IMPLANT

## 2015-09-01 NOTE — Progress Notes (Addendum)
  [facility]in Specialists Progress Note  S/p aortobifemoral bypass, day of surgery   Reporting left foot numbness which is comparable to pre-op.   Audible doppler flow left PT, foot is warm and pink. Palpable right PT pulse. Abdomen mildly distended, but soft. Per nursing staff, laparotomy dressing reinforced due to bleeding. Bandage is clean upon inspection. Right groin dressing with moderate drainage. Left dressing clean.   Stable post-op. Hypertension now stable. Good UOP.    Maris BergerKimberly Trinh, PA-C [facility] Office: [phone] Pager: [phone] 09/01/2015 3:54 PM     I have examined the patient, reviewed and agree with above.  Gretta BeganEarly, Zak Gondek, MD 09/01/2015 4:22 PM

## 2015-09-01 NOTE — Progress Notes (Signed)
Patient complaining of numbness to LLE. Dr Early notified and okay with patient being transported out of PACU and to 2s16. Will see patient this afternoon in his room.

## 2015-09-01 NOTE — Anesthesia Procedure Notes (Signed)
Procedure Name: Intubation Date/Time: 09/01/2015 7:51 AM Performed by: Rogelia BogaMUELLER, Zeppelin Commisso P Pre-anesthesia Checklist: Patient identified, Emergency Drugs available, Suction available, Patient being monitored and Timeout performed Patient Re-evaluated:Patient Re-evaluated prior to inductionOxygen Delivery Method: Circle system utilized Preoxygenation: Pre-oxygenation with 100% oxygen Intubation Type: IV induction Ventilation: Mask ventilation without difficulty Laryngoscope Size: Mac and 4 Grade View: Grade II Tube type: Oral Tube size: 7.5 mm Number of attempts: 1 Airway Equipment and Method: Stylet Placement Confirmation: ETT inserted through vocal cords under direct vision Secured at: 22 cm Tube secured with: Tape Dental Injury: Teeth and Oropharynx as per pre-operative assessment

## 2015-09-01 NOTE — Anesthesia Postprocedure Evaluation (Signed)
Anesthesia Post Note  Patient: Alfred Holmes  Procedure(s) Performed: Procedure(s) (LRB): AORTOBIFEMORAL BYPASS GRAFT (Bilateral)  Patient location during evaluation: PACU Anesthesia Type: General Level of consciousness: awake and alert Pain management: pain level controlled Vital Signs Assessment: post-procedure vital signs reviewed and stable Respiratory status: spontaneous breathing, nonlabored ventilation, respiratory function stable and patient connected to nasal cannula oxygen Cardiovascular status: blood pressure returned to baseline and stable Postop Assessment: no signs of nausea or vomiting Anesthetic complications: no    Last Vitals:  Filed Vitals:   09/01/15 1355 09/01/15 1405  BP: 130/69 141/73  Pulse: 87 88  Temp:  36.4 C  Resp: 24 26    Last Pain:  Filed Vitals:   09/01/15 1406  PainSc: Asleep                 Dorris Pierre DAVID

## 2015-09-01 NOTE — Anesthesia Preprocedure Evaluation (Addendum)
Anesthesia Evaluation  Patient identified by MRN, date of birth, ID band Patient awake    Reviewed: NPO status , Patient's Chart, lab work & pertinent test results  Airway Mallampati: II  TM Distance: >3 FB Neck ROM: Full    Dental  (+) Teeth Intact, Dental Advisory Given   Pulmonary Current Smoker,    Pulmonary exam normal        Cardiovascular + CAD, + Past MI, + Cardiac Stents and + Peripheral Vascular Disease  Normal cardiovascular exam  Asymptomatic. Cleared [facility]. Nl Stress test.   Neuro/Psych Anxiety    GI/Hepatic GERD  Controlled,  Endo/Other    Renal/GU      Musculoskeletal   Abdominal   Peds  Hematology   Anesthesia Other Findings   Reproductive/Obstetrics                           Anesthesia Physical Anesthesia Plan  ASA: III  Anesthesia Plan: General   Post-op Pain Management:    Induction: Intravenous  Airway Management Planned: Oral ETT  Additional Equipment: Arterial line, CVP, PA Cath and Ultrasound Guidance Line Placement  Intra-op Plan:   Post-operative Plan: Extubation in OR and Possible Post-op intubation/ventilation  Informed Consent: I have reviewed the patients History and Physical, chart, labs and discussed the procedure including the risks, benefits and alternatives for the proposed anesthesia with the patient or authorized representative who has indicated his/her understanding and acceptance.   Dental advisory given  Plan Discussed with: CRNA and Surgeon  Anesthesia Plan Comments:        Anesthesia Quick Evaluation

## 2015-09-01 NOTE — H&P (View-Only) (Signed)
[facility]ist [facility] [place]  Patient name: Alfred [place] MRN: [id number] DOB: 1950-10-02 Sex: male  REASON FOR CONSULT: Patient seen today for severe lower from the arterial insufficiency. He is a very pleasant 65 year old gentleman has had progressive symptoms of pain and numbness in both lower extremities. He reports that crap calf cramping with minimal walking and also reports that he has been unable to sleep due to his left foot waking him up at night. He reports that he is able to walk a short distance have some relief going to back to bed view immediately awakened again with severe pain. Fortunately has had no tissue loss.  Not have any history of prior arterial treatment. No history of stroke. Does have a history of prior coronary stenting  Past Medical History  Diagnosis Date  . Peripheral arterial disease (HCC)     Family History  Problem Relation Age of Onset  . Heart disease Mother   . Heart disease Father     SOCIAL HISTORY: Social History   Social History  . Marital Status: Single    Spouse Name: N/A  . Number of Children: N/A  . Years of Education: N/A   Occupational History  . Not on file.   Social History Main Topics  . Smoking status: Current Every Day Smoker -- 1.00 packs/day    Types: Cigarettes  . Smokeless tobacco: Not on file  . Alcohol Use: No  . Drug Use: No  . Sexual Activity: Not on file   Other Topics Concern  . Not on file   Social History Narrative    Allergies  Allergen Reactions  . Codeine     Current Outpatient Prescriptions  Medication Sig Dispense Refill  . aspirin 81 MG tablet Take 81 mg by mouth daily.     No current facility-administered medications for this visit.    REVIEW OF SYSTEMS:  [X]  denotes positive finding, [ ]  denotes negative finding Cardiac  Comments:  Chest pain or chest pressure:    Shortness of breath upon exertion:    Short of breath when lying flat:    Irregular heart rhythm:      Vascular    Pain in calf, thigh, or hip brought on by ambulation: x   Pain in feet at night that wakes you up from your sleep:  x   Blood clot in your veins:    Leg swelling:         Pulmonary    Oxygen at home:    Productive cough:     Wheezing:         Neurologic    Sudden weakness in arms or legs:     Sudden numbness in arms or legs:     Sudden onset of difficulty speaking or slurred speech:    Temporary loss of vision in one eye:     Problems with dizziness:         Gastrointestinal    Blood in stool:     Vomited blood:         Genitourinary    Burning when urinating:     Blood in urine:        Psychiatric    Major depression:         Hematologic    Bleeding problems:    Problems with blood clotting too easily:        Skin    Rashes or ulcers:        Constitutional  Fever or chills:      PHYSICAL EXAM: Filed Vitals:   08/12/15 1102 08/12/15 1108  BP: 150/88 148/80  Pulse: 70 70  Temp: 97 F (36.1 C)   TempSrc: Oral   Resp: 16   Height:  (1.727 m)   Weight: 172 lb (78.019 kg)   SpO2: 96%     GENERAL: The patient is a well-nourished male, in no acute distress. The vital signs are documented above. CARDIAC: There is a regular rate and rhythm.  VASCULAR: 2+ radial pulses bilaterally. Absent femoral popliteal and distal pulses bilaterally PULMONARY: There is good air exchange bilaterally without wheezing or rales. ABDOMEN: Soft and non-tender with normal pitched bowel sounds. No bruits noted MUSCULOSKELETAL: There are no major deformities or cyanosis. NEUROLOGIC: No focal weakness or paresthesias are detected. SKIN: There are no ulcers or rashes noted. Dependent rubor noted in his left foot. PSYCHIATRIC: The patient has a normal affect.  DATA:  Ankle arm index 0.35 on the right and 0.25 on the left  I did review his CT angi[facility] from 07/28/2015. This reveals narrowing and irregularity below the level the renals. 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 There is reconstitution of the common femoral arteries with runoff in the superficial femoral and profundus femoris artery. He does appear to have occlusion in his left superficial femoral artery as well.  MEDICAL ISSUES: Had long discussion with the patient and his girlfriend present. Explain the cause of his claudication and rest pain. Explain the treatment option of aortobifemoral bypass. Splane the magnitude of the procedure with expected 1-2 day intensive care stay and 5-7 day hospitalization. Also explain the prolonged recovery this magnitude of surgery. Will obtain cardiac evaluation prior to surgery and schedule surgery immediately following this. Explained that he is at risk for limb loss due to this level of ischemia.   Gretta Began [facility] [place] Beeper: [phone]

## 2015-09-01 NOTE — Interval H&P Note (Signed)
History and Physical Interval Note:  09/01/2015 7:04 AM  Alfred [place]  has presented today for surgery, with the diagnosis of Peripheral vascular disease with left lower extremity rest pain I70.222  The various methods of treatment have been discussed with the patient and family. After consideration of risks, benefits and other options for treatment, the patient has consented to  Procedure(s): AORTOBIFEMORAL BYPASS GRAFT (Bilateral) as a surgical intervention .  The patient's history has been reviewed, patient examined, no change in status, stable for surgery.  I have reviewed the patient's chart and labs.  Questions were answered to the patient's satisfaction.     Gretta BeganEarly, Todd

## 2015-09-01 NOTE — Transfer of Care (Signed)
Immediate Anesthesia Transfer of Care Note  Patient: Alfred SageRandy Woodrow  Procedure(s) Performed: Procedure(s): AORTOBIFEMORAL BYPASS GRAFT (Bilateral)  Patient Location: PACU  Anesthesia Type:General  Level of Consciousness: awake, alert , oriented and patient cooperative  Airway & Oxygen Therapy: Patient Spontanous Breathing and Patient connected to nasal cannula oxygen  Post-op Assessment: Report given to RN, Post -op Vital signs reviewed and stable and Patient moving all extremities X 4  Post vital signs: Reviewed and stable  Last Vitals:  Filed Vitals:   09/01/15 0544 09/01/15 1232  BP: 166/84 148/78  Pulse: 68 92  Temp: 36.6 C 36.5 C  Resp: 18 26    Last Pain: There were no vitals filed for this visit.    Patients Stated Pain Goal: 1 (09/01/15 0603)  Complications: No apparent anesthesia complications

## 2015-09-01 NOTE — Op Note (Signed)
OPERATIVE REPORT  DATE OF SURGERY: 09/01/2015  PATIENT: Alfred Holmes, 65 y.o. 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  PHYSICIAN ASSISTANT: Karsten Ro PA-C  ANESTHESIA:  Gen.  EBL: 400 ml  Total I/O In: 4875 [I.V.:4000; Blood:125; IV Piggyback:750] Out: 2020 [Urine:1600; Emesis/NG output:20; Blood:400]  BLOOD ADMINISTERED: Cell Saver 125 cc  DRAINS: None  SPECIMEN: None  COUNTS CORRECT:  YES  PLAN OF CARE: PACU extubated in stable   PATIENT DISPOSITION:  PACU - hemodynamically stable  PROCEDURE DETAILS: Patient was taken to the operating placed supine position where the area of the abdomen both groins prepped and draped in usual sterile fashion. Incision was made from the level of the xiphoid to just below the umbilicus and the subcutaneous fat was divided in line with skin incision with left cautery. The midline linea alba fascia was opened with electrocautery in line with skin incision continued down to the level the umbilicus. The patient had a small umbilical hernia. This was mobilized from its communication from the fascia. There were no intraperitoneal contents inside this hernia sac. The skin was inadvertently opened at the umbilicus and this was a closed with 3-0 subcuticular nylon at the end of the case. The hernia sac was mobilized from the fascia for closure at the end of the case as well. The stomach liver gallbladder small and large intestines were normal. The transverse colon omentum reflected superiorly and the small bowel was reflected to the right. The omni-Trac retractor was used for exposure. The duodenum was mobilized off the aorta. The aorta was exposed just below the level of the left renal vein. The aorta was mobilized circumferentially  at this level. There was also mobilized down to the level of the inferior mesenteric artery. Dissection was continued in the retropharyngeal space down onto the iliac artery bifurcation. Next separate incisions were made in the right and left groin over the common femoral artery. The patient did not have femoral pulses. The common superficial femoral and profundus femoris arteries were isolated bilaterally and controlled with Vesseloops. The dissection was continued up under the inguinal ligaments bilaterally and the crossing vein just above the inguinal ligaments were divided bilaterally with 3-0 silk ties. A tunnel was created from the level of the groin to the aortic bifurcation bilaterally taking care to pass behind the level of the ureters bilaterally. A umbilical tape was brought through this tunnel. Patient was given 8000 units of heparin and 25 g of mannitol. After adequate circulation time the aorta was occluded below the level the renal arteries with a Hartmann clamp. The distal aorta was occluded above the level of the IMA with aortic clamp. The aorta was transected below the level of the proximal clamp. Lumbar vessels in the excluded segment were controlled with vessel clips. An ellipse of aorta was removed to allow for anastomosis and placement of the graft. A felt strip was used for reinforcement of the proximal anastomosis. The distal aorta was thrombosed. Thrombus was removed from this to allow closure of the distal aorta above the level of the IMA. This was done with a 3-0 Prolene suture in 2 layers. Next a 14 x 8 Hemashield graft was brought onto the field and was cut to appropriate length. The graft was sewn into into the aorta with a  running 3-0 Prolene suture. This anastomosis was tested and found to be adequate. The clamps were placed on the graft limbs themselves and the right and left limb of the graft were brought to the respective groin to the prior created tunnels. The right common  superficial femoral and profundus reverse arteries were occluded. The common femoral artery was opened with an 11 blade incision longitudinally with Potts scissors. The graft cut to appropriate length and was sewn end-to-side distal common femoral artery just above the superficial femoral artery origin. 3 dilator passed easily to the superficial femoral artery. The anastomosis was with a 5-0 Prolene suture. Prior to completion of the anastomosis the usual flushing maneuvers were undertaken. Anastomosis completed and flow was restored to the right leg. Next attention was turned to the left leg. The left common femoral artery was occluded with a Henley clamp. The superficial femoral occluded with a vascular vessel loop Potts tie and the profunda was occluded with a Gregory clamp. The artery was opened with 11 blade and the common femoral artery and extending down to the origin of the superficial artery. There was plaque present that was partially narrowing the superficial artery takeoff. This was endarterectomized as was the common femoral artery. The plaque extended to the profundus femoris artery but that was not causing any flow limitation. The proximal common femoral artery plaque was tacked with 6-0 Prolene sutures in the distal plaque was tacked at the SFA origin. The graft limb was cut to appropriate length and was sewn end-to-side to the artery with a running 5-0 Prolene suture. Prior to completion of the closure the usual flushing maneuvers were undertaken. Clamps removed in the good Doppler flow is noted in the superficial femoral artery and deep femoral arteries bilaterally. The patient was given 50 mg of protamine to reverse heparin. Wounds irrigated with saline. Hemostasis tablet cautery. The wounds were closed with 2-0 Vicryl to close the retroperitoneal over the graft to exclude this from the small bowel. The small bowel was run its in its entirety and found to be without injury. Small plantar bowel was  placed back in the pelvis and the transverse colon was replaced over this. The midline fascia was closed with a #1 PDS suture beginning proximally and distally and tying in the middle. Care was taken at the prior level of the umbilical hernia for closure. Next the groin wounds irrigated. The groins were closed with 2 layers of 2-0 Vicryl in the subcutaneous tissue. The groin incision scan and abdominal skin were closed with 3-0 subcuticular Vicryl suture. Benzoin and Steri-Strips were applied and sterile dressing were applied. The patient was extubated in the operating room and transferred to the recovery room in stable condition   Gretta Beganodd Vidal Lampkins, M.D. 09/01/2015 2:57 PM

## 2015-09-02 ENCOUNTER — Encounter (HOSPITAL_COMMUNITY): Payer: Self-pay | Admitting: Vascular Surgery

## 2015-09-02 ENCOUNTER — Inpatient Hospital Stay (HOSPITAL_COMMUNITY): Payer: Medicare Other

## 2015-09-02 LAB — COMPREHENSIVE METABOLIC PANEL
ALK PHOS: 55 U/L (ref 38–126)
ALT: 20 U/L (ref 17–63)
AST: 17 U/L (ref 15–41)
Albumin: 3.5 g/dL (ref 3.5–5.0)
Anion gap: 6 (ref 5–15)
BILIRUBIN TOTAL: 0.9 mg/dL (ref 0.3–1.2)
BUN: 8 mg/dL (ref 6–20)
CALCIUM: 8.3 mg/dL — AB (ref 8.9–10.3)
CO2: 26 mmol/L (ref 22–32)
CREATININE: 0.82 mg/dL (ref 0.61–1.24)
Chloride: 104 mmol/L (ref 101–111)
GFR calc non Af Amer: >60 mL/min (ref >60)
Glucose, Bld: 149 mg/dL — ABNORMAL HIGH (ref 65–99)
Potassium: 3.7 mmol/L (ref 3.5–5.1)
Sodium: 136 mmol/L (ref 135–145)
Total Protein: 5.7 g/dL — ABNORMAL LOW (ref 6.5–8.1)

## 2015-09-02 LAB — POCT I-STAT 3, ART BLOOD GAS (G3+)
Bicarbonate: 25.2 mEq/L — ABNORMAL HIGH (ref 20.0–24.0)
O2 Saturation: 94 %
PCO2 ART: 42.7 mmHg (ref 35.0–45.0)
PH ART: 7.378 (ref 7.350–7.450)
Patient temperature: 36.9
TCO2: 26 mmol/L (ref 0–100)
pO2, Arterial: 71 mmHg — ABNORMAL LOW (ref 80.0–100.0)

## 2015-09-02 LAB — LIPID PANEL
CHOL/HDL RATIO: 4.2 ratio
Cholesterol: 106 mg/dL (ref 0–200)
HDL: 25 mg/dL — ABNORMAL LOW (ref >40)
LDL Cholesterol: 69 mg/dL (ref 0–99)
Triglycerides: 60 mg/dL (ref <150)
VLDL: 12 mg/dL (ref 0–40)

## 2015-09-02 LAB — CBC
HEMATOCRIT: 40.8 % (ref 39.0–52.0)
HEMOGLOBIN: 13.7 g/dL (ref 13.0–17.0)
MCH: 31 pg (ref 26.0–34.0)
MCHC: 33.6 g/dL (ref 30.0–36.0)
MCV: 92.3 fL (ref 78.0–100.0)
Platelets: 169 10*3/uL (ref 150–400)
RBC: 4.42 MIL/uL (ref 4.22–5.81)
RDW: 12.6 % (ref 11.5–15.5)
WBC: 12.4 10*3/uL — ABNORMAL HIGH (ref 4.0–10.5)

## 2015-09-02 LAB — GLUCOSE, CAPILLARY
GLUCOSE-CAPILLARY: 138 mg/dL — AB (ref 65–99)
GLUCOSE-CAPILLARY: 139 mg/dL — AB (ref 65–99)
GLUCOSE-CAPILLARY: 144 mg/dL — AB (ref 65–99)
GLUCOSE-CAPILLARY: 153 mg/dL — AB (ref 65–99)
Glucose-Capillary: 120 mg/dL — ABNORMAL HIGH (ref 65–99)
Glucose-Capillary: 148 mg/dL — ABNORMAL HIGH (ref 65–99)

## 2015-09-02 LAB — AMYLASE: AMYLASE: 62 U/L (ref 28–100)

## 2015-09-02 LAB — MAGNESIUM: Magnesium: 2 mg/dL (ref 1.7–2.4)

## 2015-09-02 MED ORDER — PNEUMOCOCCAL VAC POLYVALENT 25 MCG/0.5ML IJ INJ: 0.5000 mL | INJECTION | INTRAMUSCULAR | Status: DC | PRN

## 2015-09-02 MED ORDER — ONDANSETRON HCL 4 MG/2ML IJ SOLN
4.0000 mg | Freq: Four times a day (QID) | INTRAMUSCULAR | Status: DC | PRN
  Administered 2015-09-02 – 2015-09-04 (×2): 4 mg via INTRAVENOUS
  Filled 2015-09-02 (×2): qty 2

## 2015-09-02 MED ORDER — DIPHENHYDRAMINE HCL 50 MG/ML IJ SOLN
12.5000 mg | Freq: Four times a day (QID) | INTRAMUSCULAR | Status: DC | PRN
  Administered 2015-09-03 – 2015-09-05 (×7): 12.5 mg via INTRAVENOUS
  Filled 2015-09-02 (×7): qty 1

## 2015-09-02 MED ORDER — DIPHENHYDRAMINE HCL 12.5 MG/5ML PO ELIX
12.5000 mg | ORAL_SOLUTION | Freq: Four times a day (QID) | ORAL | Status: DC | PRN
  Administered 2015-09-05 – 2015-09-06 (×3): 12.5 mg via ORAL
  Filled 2015-09-02 (×4): qty 10

## 2015-09-02 MED ORDER — SODIUM CHLORIDE 0.9% FLUSH: 9.0000 mL | INTRAVENOUS | Status: DC | PRN

## 2015-09-02 MED ORDER — NALOXONE HCL 0.4 MG/ML IJ SOLN: 0.4000 mg | INTRAMUSCULAR | Status: DC | PRN

## 2015-09-02 MED ORDER — MORPHINE SULFATE 2 MG/ML IV SOLN
INTRAVENOUS | Status: DC
  Administered 2015-09-02: 2 mL via INTRAVENOUS
  Administered 2015-09-03: 9 mg via INTRAVENOUS
  Administered 2015-09-03: 2 mg via INTRAVENOUS
  Administered 2015-09-03: 7.5 mg via INTRAVENOUS
  Administered 2015-09-03: 6 mg via INTRAVENOUS
  Administered 2015-09-04: 03:00:00 via INTRAVENOUS
  Administered 2015-09-04: 1.5 mg via INTRAVENOUS
  Administered 2015-09-04: 4.5 mg via INTRAVENOUS
  Administered 2015-09-04: 3 mg via INTRAVENOUS
  Administered 2015-09-05 (×2): via INTRAVENOUS
  Administered 2015-09-05: 8 mg via INTRAVENOUS
  Administered 2015-09-06: 10.5 mg via INTRAVENOUS
  Administered 2015-09-06: 09:00:00 via INTRAVENOUS
  Administered 2015-09-06: 28.5 mg via INTRAVENOUS
  Filled 2015-09-02 (×6): qty 25

## 2015-09-02 MED FILL — Heparin Sodium (Porcine) Inj 1000 Unit/ML: INTRAMUSCULAR | Qty: 30 | Status: AC

## 2015-09-02 MED FILL — Sodium Chloride IV Soln 0.9%: INTRAVENOUS | Qty: 1000 | Status: AC

## 2015-09-02 NOTE — Progress Notes (Signed)
Pt has arrived to 2w from 2s. Telemetry box has been applied and CCMD was notified. Pt has PCA pump and has been re-educated about using it. Pt's vitals are stable. Pt has been oriented to room. Pt is resting in chair at this time. Will continue to monitor.   Berdine DanceLauren Moffitt RN, BSN

## 2015-09-02 NOTE — Progress Notes (Signed)
[facility]  Subjective  - Doing well over all.  Pain is better controlled with start of PCA.  Some nausea, but no vomiting.  Tolerating small amounts of ice chips.   Objective 143/82 96 98 F (36.7 C) (Oral) 24 97%  Intake/Output Summary (Last 24 hours) at 09/02/15 1602 Last data filed at 09/02/15 1500  Gross per 24 hour  Intake   3285 ml  Output   1820 ml  Net   1465 ml    Abdomin distended minimal, incisional pain.  Small hypogastric BS. Feet warm PT doppler signals bilateral, sensation right > left. Groins soft without hematoma UO good Heart RRR Lungs non labored breathing  Assessment/Planning: POD #1 aortobifemoral bypass  NG tube D/C'd Ice chips Transfer to 2W PCA started for pain control   Thomasena EdisCOLLINS, Esdras Delair MAUREEN 09/02/2015 4:02 PM --  Laboratory Lab Results:  Recent Labs  09/01/15 1245 09/02/15 0305  WBC 19.5* 12.4*  HGB 13.8 13.7  HCT 40.7 40.8  PLT 191 169   BMET  Recent Labs  09/01/15 1245 09/01/15 1530 09/02/15 0305  NA 137  --  136  K 3.6  --  3.7  CL 105  --  104  CO2 24  --  26  GLUCOSE 210*  --  149*  BUN 10  --  8  CREATININE 0.94 0.79 0.82  CALCIUM 8.5*  --  8.3*    COAG Lab Results  Component Value Date   INR 1.18 09/01/2015   INR 0.95 08/25/2015   No results found for: PTT

## 2015-09-02 NOTE — Progress Notes (Signed)
OT Cancellation Note  Patient Details Name: Carroll SageRandy Tholl MRN: [id number] DOB: 02-11-1951   Cancelled Treatment:    Reason Eval/Treat Not Completed: Pt refused. Fatigue/lethargy limiting ability to participate;Pain limiting ability to participate. Will follow.  Evern BioMayberry, Granvil Djordjevic Lynn 09/02/2015, 2:57 PM

## 2015-09-02 NOTE — Evaluation (Signed)
Physical Therapy Evaluation Patient Details Name: Alfred Holmes MRN: [id number] DOB: 13-Mar-1951 Today's Date: 09/02/2015   History of Present Illness  Patient seen today for severe lower from the arterial insufficiency. He is a very pleasant 65 year old gentleman has had progressive symptoms of pain and numbness in both lower extremities. He reports that crap calf cramping with minimal walking and also reports that he has been unable to sleep due to his left foot waking him up at night. He reports that he is able to walk a short distance have some relief going to back to bed view immediately awakened again with severe pain. Fortunately has had no tissue loss.  Had aortobifemoral BPG on 09/01/15.   Clinical Impression  Pt admitted with above diagnosis. Pt currently with functional limitations due to the deficits listed below (see PT Problem List). Pt was able to ambulate but limited by pain.  Should progress well with therapy and go home with HHPT f/u.   Pt will benefit from skilled PT to increase their independence and safety with mobility to allow discharge to the venue listed below.      Follow Up Recommendations Home health PT;Supervision/Assistance - 24 hour    Equipment Recommendations  Rolling walker with 5" wheels    Recommendations for Other Services       Precautions / Restrictions Precautions Precautions: Fall Restrictions Weight Bearing Restrictions: No      Mobility  Bed Mobility Overal bed mobility: Needs Assistance Bed Mobility: Rolling;Sidelying to Sit Rolling: Min assist Sidelying to sit: Min assist       General bed mobility comments: Pt needed assist to get to EOB due to pain.  Incr time needed.   Transfers Overall transfer level: Needs assistance Equipment used: Pushed w/c Transfers: Sit to/from Stand Sit to Stand: Min guard         General transfer comment: cues for sit to stand  Ambulation/Gait Ambulation/Gait assistance: Min guard Ambulation  Distance (Feet): 45 Feet Assistive device:  (pushing wheelchair) Gait Pattern/deviations: Step-to pattern;Decreased step length - right;Decreased step length - left;Decreased stride length;Antalgic;Drifts right/left   Gait velocity interpretation: Below normal speed for age/gender General Gait Details: Pt was able to ambulate pushing wheelchair with min guard assist only.  Limited by pain as pt had to turn around due to incr pain with ambulation  Stairs            Wheelchair Mobility    Modified Rankin (Stroke Patients Only)       Balance Overall balance assessment: Needs assistance Sitting-balance support: No upper extremity supported;Feet supported Sitting balance-Leahy Scale: Fair     Standing balance support: Bilateral upper extremity supported;During functional activity Standing balance-Leahy Scale: Poor Standing balance comment: relies on UE support.                             Pertinent Vitals/Pain Pain Assessment: 0-10 Pain Score: 8  Pain Location: abdomen Pain Descriptors / Indicators: Aching;Grimacing;Guarding;Sharp;Sore Pain Intervention(s): Limited activity within patient's tolerance;Monitored during session;Patient requesting pain meds-RN notified;Repositioned;RN gave pain meds during session  VSS on 2LO2 at rest.  Sats with ambulation 92% and > on RA but reapplied O2 at 2L as pt receiving pain meds.  Other VSS.      Home Living Family/patient expects to be discharged to:: Private residence Living Arrangements: Other (Comment) (sister) Available Help at Discharge: Family;Available 24 hours/day Type of Home: House Home Access: Stairs to enter Entrance Stairs-Rails: None Entrance Progress EnergyStairs-Number  of Steps: 1 Home Layout: One level Home Equipment: None      Prior Function Level of Independence: Independent               Hand Dominance        Extremity/Trunk Assessment   Upper Extremity Assessment: Defer to OT evaluation            Lower Extremity Assessment: Generalized weakness      Cervical / Trunk Assessment: Normal  Communication   Communication: No difficulties  Cognition Arousal/Alertness: Awake/alert Behavior During Therapy: WFL for tasks assessed/performed Overall Cognitive Status: Within Functional Limits for tasks assessed                      General Comments      Exercises General Exercises - Lower Extremity Ankle Circles/Pumps: AROM;Both;10 reps;Seated Long Arc Quad: AROM;Both;10 reps;Seated      Assessment/Plan    PT Assessment Patient needs continued PT services  PT Diagnosis Generalized weakness;Acute pain   PT Problem List Decreased activity tolerance;Decreased balance;Decreased mobility;Decreased knowledge of use of DME;Decreased safety awareness;Decreased knowledge of precautions;Pain  PT Treatment Interventions DME instruction;Gait training;Functional mobility training;Therapeutic activities;Stair training;Therapeutic exercise;Balance training;Patient/family education   PT Goals (Current goals can be found in the Care Plan section) Acute Rehab PT Goals Patient Stated Goal: to get better PT Goal Formulation: With patient Time For Goal Achievement: 09/16/15 Potential to Achieve Goals: Good    Frequency Min 3X/week   Barriers to discharge        Co-evaluation               End of Session Equipment Utilized During Treatment: Gait belt;Oxygen Activity Tolerance: Patient limited by fatigue;Patient limited by pain Patient left: in chair;with call bell/phone within reach;with nursing/sitter in room Nurse Communication: Mobility status         Time: 1610-9604 PT Time Calculation (min) (ACUTE ONLY): 26 min   Charges:   PT Evaluation $PT Eval Moderate Complexity: 1 Procedure PT Treatments $Gait Training: 8-22 mins   PT G CodesBerline Lopes September 30, 2015, 2:33 PM Tanvir Hipple,PT Acute Rehabilitation [phone] (pager)

## 2015-09-03 LAB — GLUCOSE, CAPILLARY
GLUCOSE-CAPILLARY: 137 mg/dL — AB (ref 65–99)
GLUCOSE-CAPILLARY: 156 mg/dL — AB (ref 65–99)
GLUCOSE-CAPILLARY: 139 mg/dL — AB (ref 65–99)
Glucose-Capillary: 141 mg/dL — ABNORMAL HIGH (ref 65–99)
Glucose-Capillary: 126 mg/dL — ABNORMAL HIGH (ref 65–99)
Glucose-Capillary: 143 mg/dL — ABNORMAL HIGH (ref 65–99)

## 2015-09-03 MED ORDER — TRAMADOL HCL 50 MG PO TABS: 50.0000 mg | ORAL_TABLET | Freq: Four times a day (QID) | ORAL | Status: DC | PRN

## 2015-09-03 NOTE — Progress Notes (Addendum)
  [facility] Progress Note  Subjective  - POD #2  Feels ok. Left foot still numb but unchanged since pre-op. Denies nausea. Not passing flatus.   Objective Filed Vitals:   09/03/15 0400 09/03/15 0420  BP:  117/73  Pulse:  120  Temp:  100 F (37.8 C)  Resp: 20 19    Intake/Output Summary (Last 24 hours) at 09/03/15 [id number] Last data filed at 09/03/15 0423  Gross per 24 hour  Intake   1100 ml  Output   1025 ml  Net     75 ml   Lungs clear CV tachy Abdomen with positive bowel sounds, soft, mild tenderness to palpation Midline incision with some oozing from umbilicus incision Bilateral groin incisions clean without hematoma Palpable right PT pulse. Left foot warm.    Assessment/Planning: 65 y.o. male is s/p: aorto-bifemoral bypass 2 Days Post-Op   Not passing flatus, but with positive bowel sounds this am. Slowly start clears.  Needs to ambulate.  Low grade fever. Likely post-op atelectasis. Encourage incentive spirometry.  Good UOP.  Tachycardic likely secondary to pain.  Doesn't like the feeling of morphine because it makes him groggy. Will try tramadol. Continue PCA for breakthrough pain.  D/c foley.    Raymond GurneyKimberly A Trinh 09/03/2015 8:08 AM --  Laboratory CBC    Component Value Date/Time   WBC 12.4* 09/02/2015 0305   HGB 13.7 09/02/2015 0305   HCT 40.8 09/02/2015 0305   PLT 169 09/02/2015 0305    BMET    Component Value Date/Time   NA 136 09/02/2015 0305   K 3.7 09/02/2015 0305   CL 104 09/02/2015 0305   CO2 26 09/02/2015 0305   GLUCOSE 149* 09/02/2015 0305   BUN 8 09/02/2015 0305   CREATININE 0.82 09/02/2015 0305   CALCIUM 8.3* 09/02/2015 0305   GFRNONAA >60 09/02/2015 0305   GFRAA >60 09/02/2015 0305    COAG Lab Results  Component Value Date   INR 1.18 09/01/2015   INR 0.95 08/25/2015   No results found for: PTT  Antibiotics Anti-infectives    Start     Dose/Rate Route Frequency Ordered Stop   09/02/15 0000  cefUROXime  (ZINACEF) 1.5 g in dextrose 5 % 50 mL IVPB     1.5 g 100 mL/hr over 30 Minutes Intravenous Every 12 hours 09/01/15 1417 09/02/15 1223   09/01/15 1215  cefUROXime (ZINACEF) 1.5 g in dextrose 5 % 50 mL IVPB  Status:  Discontinued     1.5 g 100 mL/hr over 30 Minutes Intravenous To Surgery 09/01/15 1201 09/01/15 1412   09/01/15 0600  cefUROXime (ZINACEF) 1.5 g in dextrose 5 % 50 mL IVPB     1.5 g 100 mL/hr over 30 Minutes Intravenous To ShortStay Surgical 08/31/15 1416 09/01/15 1200       Maris BergerKimberly Trinh, PA-C [facility] Office: [phone] Pager: [phone] 09/03/2015 8:08 AM     I have examined the patient, reviewed and agree with above.No nausea with clear liquids. Still no flatus or bowel movement. Continue to mobilize  Gretta BeganEarly, Estill Llerena, MD 09/03/2015 4:16 PM

## 2015-09-03 NOTE — Progress Notes (Signed)
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 He is a very pleasant 65 year old gentleman has had progressive symptoms of pain and numbness in both lower extremities. He reports that crap calf cramping with minimal walking and also reports that he has been unable to sleep due to his left foot waking him up at night. He reports that he is able to walk a short distance have some relief going to back to bed view immediately awakened again with severe pain. Fortunately has had no tissue loss.  Had aortobifemoral BPG on 09/01/15.     PT Comments    Pt admitted with above diagnosis. Pt currently with functional limitations due to balance and endurance deficits. Pt was able to ambulate further in hallway today.  Progressing well with therapy.  Pt will benefit from skilled PT to increase their independence and safety with mobility to allow discharge to the venue listed below.    Follow Up Recommendations  Home health PT;Supervision/Assistance - 24 hour     Equipment Recommendations  Rolling walker with 5" wheels    Recommendations for Other Services       Precautions / Restrictions Precautions Precautions: Fall Restrictions Weight Bearing Restrictions: No    Mobility  Bed Mobility Overal bed mobility: Needs Assistance Bed Mobility: Rolling;Sidelying to Sit Rolling: Min assist Sidelying to sit: Min assist       General bed mobility comments: Pt needed assist to get to EOB due to pain.  Incr time needed.   Transfers Overall transfer level: Needs assistance Equipment used: Rolling walker (2 wheeled) Transfers: Sit to/from Stand Sit to Stand: Min guard         General transfer comment: cues for sit to stand  Ambulation/Gait Ambulation/Gait assistance: Min guard;Supervision Ambulation Distance (Feet): 165  Feet Assistive device: Rolling walker (2 wheeled) Gait Pattern/deviations: Step-through pattern;Decreased stride length;Antalgic   Gait velocity interpretation: Below normal speed for age/gender General Gait Details: Pt was able to ambulate with RW with min guard assist only.  Pt a little diaphoretic at end of walk but recovered quickly once he sat down.   Stairs            Wheelchair Mobility    Modified Rankin (Stroke Patients Only)       Balance Overall balance assessment: Needs assistance Sitting-balance support: No upper extremity supported;Feet supported Sitting balance-Leahy Scale: Good     Standing balance support: Bilateral upper extremity supported;During functional activity Standing balance-Leahy Scale: Poor Standing balance comment: relies on UE support with standing.              High level balance activites: Turns;Sudden stops;Direction changes High Level Balance Comments: min guard assist with above    Cognition Arousal/Alertness: Awake/alert Behavior During Therapy: WFL for tasks assessed/performed Overall Cognitive Status: Within Functional Limits for tasks assessed                      Exercises General Exercises - Lower Extremity Ankle Circles/Pumps: AROM;Both;10 reps;Seated Long Arc Quad: AROM;Both;10 reps;Seated    General Comments        Pertinent Vitals/Pain Pain Assessment: 0-10 Pain Score: 5  Pain Location: abdomen Pain Descriptors / Indicators: Grimacing;Guarding Pain Intervention(s): Limited activity within patient's tolerance;Monitored during session;Premedicated before session;PCA encouraged;Repositioned  O2 88-92% on RA with ambulation.  Sats >90% on 3LO2 at rest with PCA.  Nurse informed that pt itching.      Home Living                      Prior Function            PT Goals (current goals can now be found in the care plan section) Progress towards PT goals: Progressing toward goals     Frequency  Min 3X/week    PT Plan Current plan remains appropriate    Co-evaluation             End of Session Equipment Utilized During Treatment: Gait belt;Oxygen Activity Tolerance: Patient limited by fatigue;Patient limited by pain Patient left: in chair;with call bell/phone within reach;with family/visitor present     Time: [id number] PT Time Calculation (min) (ACUTE ONLY): 23 min  Charges:  $Gait Training: 8-22 mins $Therapeutic Exercise: 8-22 mins                    G Codes:      Lekha Dancer F 09/03/2015, 2:20 PM Dannya Pitkin,PT Acute Rehabilitation [phone] (pager)

## 2015-09-03 NOTE — Progress Notes (Signed)
Pt states morphine in PCA pump it making him itch. Pt received benadryl and reported relief. MD and PA paged to make them aware. Waiting for response. Will continue to monitor pt.

## 2015-09-03 NOTE — Care Management Important Message (Signed)
Important Message  Patient Details  Name: Alfred Holmes MRN: [id number] Date of Birth: 04/30/1950   Medicare Important Message Given:  Yes    Bernadette HoitShoffner, Camiya Vinal Coleman 09/03/2015, 11:14 AM

## 2015-09-04 LAB — BASIC METABOLIC PANEL
ANION GAP: 6 (ref 5–15)
BUN: <5 mg/dL — ABNORMAL LOW (ref 6–20)
CHLORIDE: 100 mmol/L — AB (ref 101–111)
CO2: 27 mmol/L (ref 22–32)
Calcium: 8.3 mg/dL — ABNORMAL LOW (ref 8.9–10.3)
Creatinine, Ser: 0.85 mg/dL (ref 0.61–1.24)
GFR calc non Af Amer: >60 mL/min (ref >60)
GLUCOSE: 153 mg/dL — AB (ref 65–99)
POTASSIUM: 4.2 mmol/L (ref 3.5–5.1)
Sodium: 133 mmol/L — ABNORMAL LOW (ref 135–145)

## 2015-09-04 LAB — GLUCOSE, CAPILLARY
GLUCOSE-CAPILLARY: 167 mg/dL — AB (ref 65–99)
GLUCOSE-CAPILLARY: 133 mg/dL — AB (ref 65–99)
Glucose-Capillary: 122 mg/dL — ABNORMAL HIGH (ref 65–99)
Glucose-Capillary: 171 mg/dL — ABNORMAL HIGH (ref 65–99)
Glucose-Capillary: 140 mg/dL — ABNORMAL HIGH (ref 65–99)
Glucose-Capillary: 130 mg/dL — ABNORMAL HIGH (ref 65–99)

## 2015-09-04 LAB — CBC
HEMATOCRIT: 37.1 % — AB (ref 39.0–52.0)
HEMOGLOBIN: 12 g/dL — AB (ref 13.0–17.0)
MCH: 30.6 pg (ref 26.0–34.0)
MCHC: 32.3 g/dL (ref 30.0–36.0)
MCV: 94.6 fL (ref 78.0–100.0)
Platelets: 156 10*3/uL (ref 150–400)
RBC: 3.92 MIL/uL — AB (ref 4.22–5.81)
RDW: 12.6 % (ref 11.5–15.5)
WBC: 11.2 10*3/uL — AB (ref 4.0–10.5)

## 2015-09-04 MED ORDER — PROMETHAZINE HCL 25 MG PO TABS
12.5000 mg | ORAL_TABLET | Freq: Four times a day (QID) | ORAL | Status: DC | PRN
  Administered 2015-09-04: 12.5 mg via ORAL
  Filled 2015-09-04: qty 1

## 2015-09-04 MED ORDER — ALPRAZOLAM 0.25 MG PO TABS
0.2500 mg | ORAL_TABLET | Freq: Three times a day (TID) | ORAL | Status: DC | PRN
  Administered 2015-09-04 – 2015-09-07 (×4): 0.25 mg via ORAL
  Filled 2015-09-04 (×4): qty 1

## 2015-09-04 NOTE — Evaluation (Signed)
Occupational Therapy Evaluation Patient Details Name: Alfred SageRandy Holmes MRN: [id number] DOB: 1951-03-21 Today's Date: 09/04/2015    History of Present Illness s/p aortobifemoral bypass 09/01/15   Clinical Impression   Limited evaluation due to fatigue and lethargy. Pt presents with decreased activity tolerance, impaired balance and post operative pain interfering with ability to perform ADL and is baseline independent level. Will follow acutely.    Follow Up Recommendations  Supervision/Assistance - 24 hour;Home health OT    Equipment Recommendations  3 in 1 bedside comode    Recommendations for Other Services       Precautions / Restrictions Precautions Precautions: Fall Restrictions Weight Bearing Restrictions: No      Mobility Bed Mobility Overal bed mobility: Needs Assistance Bed Mobility: Rolling;Sidelying to Sit;Sit to Sidelying Rolling: Min assist Sidelying to sit: Min assist     Sit to sidelying: Min assist General bed mobility comments: instructed in log roll technique to minimize pain, increased time  Transfers Overall transfer level: Needs assistance Equipment used: Rolling walker (2 wheeled) Transfers: Sit to/from Stand Sit to Stand: Min assist         General transfer comment: min assist to steady    Balance     Sitting balance-Leahy Scale: Good       Standing balance-Leahy Scale: Poor                              ADL Overall ADL's : Needs assistance/impaired Eating/Feeding: Independent;Bed level   Grooming: Wash/dry hands;Wash/dry face;Sitting;Set up   Upper Body Bathing: Minimal assitance;Sitting   Lower Body Bathing: Total assistance;Sit to/from stand   Upper Body Dressing : Minimal assistance;Sitting   Lower Body Dressing: Total assistance;Sit to/from stand                 General ADL Comments: Pt got to EOB and stood to [facility] and then politely asked for therapist to return later. Reports fatigue  after bath with NT. Pt falling asleep during conversation.     Vision     Perception     Praxis      Pertinent Vitals/Pain Pain Assessment: Faces Faces Pain Scale: Hurts even more Pain Location: abdomen Pain Descriptors / Indicators: Grimacing;Guarding;Sore Pain Intervention(s): Limited activity within patient's tolerance;Monitored during session;Repositioned;PCA encouraged     Hand Dominance Right   Extremity/Trunk Assessment Upper Extremity Assessment Upper Extremity Assessment: Overall WFL for tasks assessed   Lower Extremity Assessment Lower Extremity Assessment: Defer to PT evaluation   Cervical / Trunk Assessment Cervical / Trunk Assessment: Normal   Communication Communication Communication: No difficulties   Cognition Arousal/Alertness: Lethargic;Suspect due to medications Behavior During Therapy: [facility] Endoscopy[facility]FL for tasks assessed/performed Overall Cognitive Status: Difficult to assess                     General Comments       Exercises       Shoulder Instructions      Home Living Family/patient expects to be discharged to:: Private residence Living Arrangements: Other relatives (sister) Available Help at Discharge: Family;Available 24 hours/day Type of Home: House Home Access: Stairs to enter Entergy CorporationEntrance Stairs-Number of Steps: 1 Entrance Stairs-Rails: None Home Layout: One level     Bathroom Shower/Tub: Chief Strategy OfficerTub/shower unit   Bathroom Toilet: Standard     Home Equipment: None          Prior Functioning/Environment Level of Independence: Independent  OT Diagnosis: Generalized weakness;Acute pain   OT Problem List: Decreased strength;Impaired balance (sitting and/or standing);Decreased knowledge of use of DME or AE;Pain;Decreased activity tolerance   OT Treatment/Interventions: Self-care/ADL training;DME and/or AE instruction;Energy conservation;Patient/family education;Balance training    OT Goals(Current goals can be found in  the care plan section) Acute Rehab OT Goals Patient Stated Goal: to get better OT Goal Formulation: With patient Time For Goal Achievement: 09/11/15 Potential to Achieve Goals: Good ADL Goals Pt Will Perform Grooming: with supervision;standing Pt Will Perform Lower Body Bathing: with supervision;sit to/from stand;with adaptive equipment Pt Will Perform Lower Body Dressing: with supervision;with adaptive equipment;sit to/from stand Pt Will Transfer to Toilet: with supervision;ambulating;bedside commode (over toilet as needed) Pt Will Perform Toileting - Clothing Manipulation and hygiene: with supervision;sit to/from stand Pt Will Perform Tub/Shower Transfer: Tub transfer;with supervision;ambulating;rolling walker Additional ADL Goal #1: Pt will utilize energy conservation strategies in ADL with supervision.  OT Frequency: Min 2X/week   Barriers to D/C:            Co-evaluation              End of Session Equipment Utilized During Treatment: Rolling walker;Oxygen  Activity Tolerance: Patient limited by lethargy;Patient limited by pain Patient left: in bed;with call bell/phone within reach;with bed alarm set   Time: 1152-1206 OT Time Calculation (min): 14 min Charges:  OT General Charges $OT Visit: 1 Procedure OT Evaluation $OT Eval Moderate Complexity: 1 Procedure G-Codes:    Evern Bio 09/04/2015, 12:16 PM  [phone]

## 2015-09-04 NOTE — Progress Notes (Signed)
Subjective: Interval History: none.. Uncomfortable last night. Had pain and also anxiety. Did have some itching which was relieved with Benadryl. Continuing PCA morphine. Mild nausea at one point no vomiting. No flatus.  Objective: Vital signs in last 24 hours: Temp:  [98.2 F (36.8 C)-98.6 F (37 C)] 98.2 F (36.8 C) (05/25 0400) Pulse Rate:  [87-99] 95 (05/25 0400) Resp:  [16-28] 27 (05/25 0400) BP: (119-164)/(71-81) 164/81 mmHg (05/25 0400) SpO2:  [94 %-99 %] 95 % (05/25 0400)  Intake/Output from previous day: 05/24 0701 - 05/25 0700 In: -  Out: 200 [Urine:200] Intake/Output this shift:    Abdomen soft. Some distention. Dressing removed. Some bruising from skin edge bleeder postop. Groin incisions healing with the normal 2+ femoral pulses.  Lab Results:  Recent Labs  09/02/15 0305 09/04/15 0342  WBC 12.4* 11.2*  HGB 13.7 12.0*  HCT 40.8 37.1*  PLT 169 156   BMET  Recent Labs  09/02/15 0305 09/04/15 0342  NA 136 133*  K 3.7 4.2  CL 104 100*  CO2 26 27  GLUCOSE 149* 153*  BUN 8 <5*  CREATININE 0.82 0.85  CALCIUM 8.3* 8.3*    Studies/Results: Dg Chest Port 1 View  09/02/2015  CLINICAL DATA:  Aortobifemoral bypass. EXAM: PORTABLE CHEST 1 VIEW COMPARISON:  09/01/2015 . FINDINGS: Swan-Ganz catheter and NG tube in stable position. Low lung volumes with mild bibasilar atelectasis. Heart size normal. Tiny left pleural effusion cannot be excluded. No pneumothorax. IMPRESSION: 1.  NG tube and Swan-Ganz catheter in stable position. 2. Low lung volumes with mild bibasilar atelectasis. Small left pleural effusion cannot be excluded. Electronically Signed   By: Maisie Fushomas  Register   On: 09/02/2015 07:48   Dg Chest Port 1 View  09/01/2015  CLINICAL DATA:  Status post aortobifemoral bypass surgery EXAM: PORTABLE CHEST 1 VIEW COMPARISON:  None. FINDINGS: Cardiac shadow is mildly prominent accentuated by the portable technique. A nasogastric catheter and Swan-Ganz catheter are  noted in satisfactory position. The lungs are hypo aerated but no focal infiltrate is seen. No pneumothorax is noted. No bony abnormality is seen. IMPRESSION: Poor inspiratory effort. Tubes and lines as described. Electronically Signed   By: Alcide CleverMark  Lukens M.D.   On: 09/01/2015 14:01   Dg Abd Portable 1v  09/01/2015  CLINICAL DATA:  Status post aortobifemoral bypass surgery. EXAM: PORTABLE ABDOMEN - 1 VIEW COMPARISON:  None. FINDINGS: The bowel gas pattern is normal. No radio-opaque calculi or other significant radiographic abnormality are seen. Surgical clips are seen within mid abdomen. IMPRESSION: Nonobstructive bowel gas pattern. Electronically Signed   By: Ted Mcalpineobrinka  Dimitrova M.D.   On: 09/01/2015 13:58   Anti-infectives: Anti-infectives    Start     Dose/Rate Route Frequency Ordered Stop   09/02/15 0000  cefUROXime (ZINACEF) 1.5 g in dextrose 5 % 50 mL IVPB     1.5 g 100 mL/hr over 30 Minutes Intravenous Every 12 hours 09/01/15 1417 09/02/15 1223   09/01/15 1215  cefUROXime (ZINACEF) 1.5 g in dextrose 5 % 50 mL IVPB  Status:  Discontinued     1.5 g 100 mL/hr over 30 Minutes Intravenous To Surgery 09/01/15 1201 09/01/15 1412   09/01/15 0600  cefUROXime (ZINACEF) 1.5 g in dextrose 5 % 50 mL IVPB     1.5 g 100 mL/hr over 30 Minutes Intravenous To ShortStay Surgical 08/31/15 1416 09/01/15 1200      Assessment/Plan: s/p Procedure(s): AORTOBIFEMORAL BYPASS GRAFT (Bilateral) Stable postop day 3. Will give a suppository. Will also try  Xanax since he was on this when necessary preop for anxiety. Continue to mobilize.   LOS: 3 days   Lyrica Mcclarty 09/04/2015, 7:06 AM

## 2015-09-05 ENCOUNTER — Telehealth: Payer: Self-pay | Admitting: Vascular Surgery

## 2015-09-05 LAB — GLUCOSE, CAPILLARY
GLUCOSE-CAPILLARY: 112 mg/dL — AB (ref 65–99)
GLUCOSE-CAPILLARY: 135 mg/dL — AB (ref 65–99)
Glucose-Capillary: 113 mg/dL — ABNORMAL HIGH (ref 65–99)
Glucose-Capillary: 125 mg/dL — ABNORMAL HIGH (ref 65–99)
Glucose-Capillary: 132 mg/dL — ABNORMAL HIGH (ref 65–99)
Glucose-Capillary: 147 mg/dL — ABNORMAL HIGH (ref 65–99)

## 2015-09-05 MED ORDER — PANTOPRAZOLE SODIUM 40 MG PO TBEC
40.0000 mg | DELAYED_RELEASE_TABLET | Freq: Every day | ORAL | Status: DC
  Administered 2015-09-05 – 2015-09-07 (×3): 40 mg via ORAL
  Filled 2015-09-05 (×3): qty 1

## 2015-09-05 MED ORDER — TRAMADOL HCL 50 MG PO TABS
50.0000 mg | ORAL_TABLET | Freq: Four times a day (QID) | ORAL | Status: DC | PRN
  Administered 2015-09-06: 50 mg via ORAL
  Filled 2015-09-05: qty 1

## 2015-09-05 NOTE — Progress Notes (Signed)
Physical Therapy Treatment Patient Details Name: Alfred Holmes MRN: [id number] DOB: [birth year] Today's Date: 09/05/2015    History of Present Illness s/p aortobifemoral bypass 09/01/15    PT Comments    Pt admitted with above diagnosis. Pt currently with functional limitations due to balance and endurance deficits with issues with pain.  Pt was able to ambulate with RW with good technique.  Still having pain.  Will continue to follow acutely and progress.  Asked NT to ensure that pt walks 3x daily this weeekend with nursing.  Pt will benefit from skilled PT to increase their independence and safety with mobility to allow discharge to the venue listed below.    Follow Up Recommendations  Home health PT;Supervision/Assistance - 24 hour     Equipment Recommendations  Rolling walker with 5" wheels    Recommendations for Other Services       Precautions / Restrictions Precautions Precautions: Fall Restrictions Weight Bearing Restrictions: No    Mobility  Bed Mobility Overal bed mobility: Needs Assistance Bed Mobility: Rolling;Sidelying to Sit;Sit to Sidelying Rolling: Min guard Sidelying to sit: Min guard     Sit to sidelying: Min assist General bed mobility comments: instructed in log roll technique to minimize pain, increased time  Transfers Overall transfer level: Needs assistance Equipment used: Rolling walker (2 wheeled) Transfers: Sit to/from Stand Sit to Stand: Min guard         General transfer comment: no physical assist needed  Ambulation/Gait Ambulation/Gait assistance: Min guard;Supervision Ambulation Distance (Feet): 200 Feet Assistive device: Rolling walker (2 wheeled) Gait Pattern/deviations: Step-through pattern;Decreased step length - right;Decreased step length - left;Decreased stride length;Antalgic;Drifts right/left   Gait velocity interpretation: Below normal speed for age/gender General Gait Details: Pt was able to ambulate with RW with min  guard assist only.  No LOB with good safety with RW.     Stairs            Wheelchair Mobility    Modified Rankin (Stroke Patients Only)       Balance Overall balance assessment: Needs assistance Sitting-balance support: No upper extremity supported;Feet supported Sitting balance-Leahy Scale: Good     Standing balance support: Bilateral upper extremity supported;During functional activity Standing balance-Leahy Scale: Fair Standing balance comment: can stand statically without Ue support.              High level balance activites: Turns;Sudden stops High Level Balance Comments: supervision only    Cognition Arousal/Alertness: Awake/alert Behavior During Therapy: WFL for tasks assessed/performed Overall Cognitive Status: Within Functional Limits for tasks assessed                      Exercises General Exercises - Lower Extremity Ankle Circles/Pumps: AROM;Both;10 reps;Seated Long Arc Quad: AROM;Both;10 reps;Seated    General Comments        Pertinent Vitals/Pain Pain Assessment: 0-10 Pain Score: 6  Pain Location: abdomen Pain Descriptors / Indicators: Operative site guarding;Sore Pain Intervention(s): Limited activity within patient's tolerance;Monitored during session;Premedicated before session;Repositioned;PCA encouraged  VSS    Home Living                      Prior Function            PT Goals (current goals can now be found in the care plan section) Acute Rehab PT Goals Patient Stated Goal: stop itching Progress towards PT goals: Progressing toward goals    Frequency  Min 3X/week    PT Plan Current  plan remains appropriate    Co-evaluation             End of Session Equipment Utilized During Treatment: Gait belt;Oxygen Activity Tolerance: Patient limited by fatigue;Patient limited by pain Patient left: in chair;with call bell/phone within reach;with family/visitor present     Time: 6213-08651038-1102 PT Time  Calculation (min) (ACUTE ONLY): 24 min  Charges:  $Gait Training: 23-37 mins                    G CodesBerline Lopes:      [name] 09/05/2015, 3:41 PM Entergy CorporationDawn Josslin Sanjuan,PT Acute Rehabilitation [phone] (pager)

## 2015-09-05 NOTE — Telephone Encounter (Signed)
-----   Message from Sharee PimpleMarilyn K McChesney, RN sent at 09/04/2015 10:49 AM EDT ----- Regarding: schedule   ----- Message -----    From: Raymond GurneyKimberly A Trinh, PA-C    Sent: 09/04/2015  10:45 AM      To: [facility] Charge Pool  S/p aortobifemoral bypass 09/01/15  F/u with Dr. Arbie CookeyEarly in 2 weeks.  Thanks Selena BattenKim

## 2015-09-05 NOTE — Care Management Important Message (Signed)
Important Message  Patient Details  Name: Alfred Holmes MRN: [id number] Date of Birth: 1951/01/05   Medicare Important Message Given:  Yes    Ayvah Caroll Abena 09/05/2015, 1:24 PM

## 2015-09-05 NOTE — Progress Notes (Signed)
Occupational Therapy Treatment Patient Details Name: Alfred SageRandy Holmes MRN: [id number] DOB: [birth year] Today's Date: 09/05/2015    History of present illness s/p aortobifemoral bypass 09/01/15   OT comments  Pt seen with girlfriend participating. Pt able to don and doff socks, no AD needed. Min assist to supervision required for mobility. Educated pt in benefits of 3 in 1 and options for seated showering, but pt may not need by the time he discharges. Pt much more alert and able to participate this visit. Continues to be dependent on PCA for pain management. Progressing well, not likely to need post acute OT.  Follow Up Recommendations  No OT follow up;Supervision/Assistance - 24 hour    Equipment Recommendations  3 in 1 bedside comode (pt may refuse)    Recommendations for Other Services      Precautions / Restrictions Precautions Precautions: Fall Restrictions Weight Bearing Restrictions: No       Mobility Bed Mobility Overal bed mobility: Needs Assistance Bed Mobility: Rolling;Sidelying to Sit;Sit to Sidelying Rolling: Min guard Sidelying to sit: Min assist     Sit to sidelying: Min assist    Transfers Overall transfer level: Needs assistance Equipment used: Rolling walker (2 wheeled)   Sit to Stand: Min guard              Balance                                   ADL Overall ADL's : Needs assistance/impaired     Grooming: Wash/dry hands;Supervision/safety;Standing           Upper Body Dressing : Set up;Sitting   Lower Body Dressing: Sit to/from stand;Set up Lower Body Dressing Details (indicate cue type and reason): pt able to doff and don socks  Toilet Transfer: RW;Ambulation;Supervision/safety Toilet Transfer Details (indicate cue type and reason): stood to urinate         Functional mobility during ADLs: Supervision/safety;Rolling walker General ADL Comments: Pt reports increased pain with sit to stand from low toilet, but is  refusing 3 in 1 at this point, left on equipment list, as pt states he may change his mind. Educated in use of Nurse, adultplastic lawn chair as alternative to a shower seat as pt does not want to purchase.      Vision                     Perception     Praxis      Cognition   Behavior During Therapy: WFL for tasks assessed/performed Overall Cognitive Status: Within Functional Limits for tasks assessed                       Extremity/Trunk Assessment               Exercises     Shoulder Instructions       General Comments      Pertinent Vitals/ Pain       Pain Assessment: 0-10 Pain Score: 6  Pain Location: abdomen Pain Descriptors / Indicators: Operative site guarding;Sore Pain Intervention(s): PCA encouraged;Repositioned;Monitored during session  Home Living                                          Prior Functioning/Environment  Frequency Min 2X/week     Progress Toward Goals  OT Goals(current goals can now be found in the care plan section)  Progress towards OT goals: Progressing toward goals  Acute Rehab OT Goals Patient Stated Goal: stop itching Time For Goal Achievement: 09/11/15 Potential to Achieve Goals: Good  Plan Discharge plan needs to be updated    Co-evaluation                 End of Session     Activity Tolerance Patient tolerated treatment well   Patient Left in bed;with call bell/phone within reach;with family/visitor present   Nurse Communication  (pt itching, wants meds, IV complete)        Time: 1610-9604 OT Time Calculation (min): 17 min  Charges: OT General Charges $OT Visit: 1 Procedure OT Treatments $Self Care/Home Management : 8-22 mins  Evern Bio 09/05/2015, 1:03 PM  [phone]

## 2015-09-05 NOTE — Telephone Encounter (Signed)
Sched appt 6/13 at 2:30. Hm# has no vm, lm on cell# to inform pt of appt.

## 2015-09-05 NOTE — Progress Notes (Addendum)
[facility] Progress Note    09/05/2015 10:29 AM 4 Days Post-Op  Subjective:  Concerned about still having numbness in his feet but the cramping is better-says he has talked to Dr. Arbie Cookey about it a couple of times-once again reassured pt that this will take time.  His PCA is locked out when I came in the room-says it is the only thing that helps his pain, which he states is along his incision.  Says he's allergic to codeine that it "makes him crazy".  He received codeine at the dentist office in the past.  He states he has not been using the incentive spirometer and not getting out of bed much and only walking to the bathroom.  States he had a BM this morning.  No vomiting, but has nausea at night and thinks it could be associated with the Benadryl but not sure.  Afebrile HR  70's-90's NSR 120's-140's systolic 97% 2LO2NC  Filed Vitals:   09/05/15 0451 09/05/15 0742  BP:    Pulse:    Temp:    Resp: 20 20    Physical Exam: Cardiac:  regular Lungs:  Decreased BS at bases Abdomen:  Soft; +BS; +BM this morning; ecchymosis across lower quadrants of abdomen Incisions:  Midline incision is clean and dry with steri strips in tact; bilateral groins are clean and dry Extremities:  + palpable PT pulses bilaterally.  CBC    Component Value Date/Time   WBC 11.2* 09/04/2015 0342   RBC 3.92* 09/04/2015 0342   HGB 12.0* 09/04/2015 0342   HCT 37.1* 09/04/2015 0342   PLT 156 09/04/2015 0342   MCV 94.6 09/04/2015 0342   MCH 30.6 09/04/2015 0342   MCHC 32.3 09/04/2015 0342   RDW 12.6 09/04/2015 0342    BMET    Component Value Date/Time   NA 133* 09/04/2015 0342   K 4.2 09/04/2015 0342   CL 100* 09/04/2015 0342   CO2 27 09/04/2015 0342   GLUCOSE 153* 09/04/2015 0342   BUN <5* 09/04/2015 0342   CREATININE 0.85 09/04/2015 0342   CALCIUM 8.3* 09/04/2015 0342   GFRNONAA >60 09/04/2015 0342   GFRAA >60 09/04/2015 0342    INR    Component Value Date/Time   INR 1.18 09/01/2015 1245      Intake/Output Summary (Last 24 hours) at 09/05/15 1029 Last data filed at 09/04/15 1808  Gross per 24 hour  Intake      0 ml  Output      0 ml  Net      0 ml     Assessment/Plan:  65 y.o. male is s/p  Aortobifemoral bypass grafting 4 Days Post-Op  -pt does have palpable PT pulses bilaterally -decreased BS at bases-stressed to pt that he needs to work on the IS every hour to exercise lungs to help prevent PNA.  Also stressed to him that he needs to be out of the bed as much as possible and in the chair tid at meal times and ambulating with assistance until he is steady.  Hopefully PT will return today -did reassure pt that the numbness in his feet should get better over time; cramping is improved -pt is tolerating clear liquids, but does have some nausea at night.  He did have a BM this morning.  Will advance to full liquids today.   If he tolerates, can advance to regular diet. -Would like to wean his PCA and start oral pain medicine.  Hopefully, he will be able to tolerate Percocet  since he is tolerating the morphine (with exception of itching). -stressed to use a pillow over his incision when he coughs  -DVT prophylaxis:   Lovenox -PT recommending HHPT-face to face and CM ordered.  RW & 3 in 1 also ordered.  Doreatha MassedSamantha Rhyne, PA-C [facility] [phone] 09/05/2015 10:29 AM  I have examined the patient, reviewed and agree with above.Derrel NipMobilize  Katelinn Justice, MD 09/05/2015 1:44 PM

## 2015-09-05 NOTE — Care Management Note (Signed)
Case Management Note Donn PieriniKristi Shaniyah Wix RN, BSN Unit 2W-Case Manager [phone]  Patient Details  Name: Alfred Holmes MRN: [id number] Date of Birth: Mar 13, 1951  Subjective/Objective:     Pt admitted s/p  Aortobifemoral bypass grafting             Action/Plan: PTA pt lived at home with spouse- anticipate return home- orders have been placed for HHPT/OT- and DME 3n1- spoke with pt at bedside along with wife- [facility] [place]- per pt choice - [facility]-  Pt at this time does not feel like he will need any DME - wants to hold off ordering 3n1 and RW to see how he progresses with ambulating and getting around -will let CM/staff know at discharge if he wants equipment for home- [facility]-   Expected Discharge Date:                  Expected Discharge Plan:  Home w Home Health Services  In-House Referral:     Discharge planning Services  CM Consult  Post Acute Care Choice:  Home Health, Durable Medical Equipment Choice offered to:  Patient, Spouse  DME Arranged:    DME Agency:     HH Arranged:  PT, OT HH Agency:  [facility]  Status of Service:  In process, will continue to follow  Medicare Important Message Given:  Yes Date Medicare IM Given:    Medicare IM give by:    Date Additional Medicare IM Given:    Additional Medicare Important Message give by:     If discussed at Long Length of Stay Meetings, dates discussed:    Additional Comments:  Darrold SpanWebster, [name], RN 09/05/2015, 11:55 AM

## 2015-09-05 NOTE — Progress Notes (Signed)
Utilization review completed.  

## 2015-09-06 LAB — GLUCOSE, CAPILLARY
GLUCOSE-CAPILLARY: 134 mg/dL — AB (ref 65–99)
GLUCOSE-CAPILLARY: 138 mg/dL — AB (ref 65–99)
GLUCOSE-CAPILLARY: 140 mg/dL — AB (ref 65–99)
Glucose-Capillary: 101 mg/dL — ABNORMAL HIGH (ref 65–99)
Glucose-Capillary: 109 mg/dL — ABNORMAL HIGH (ref 65–99)
Glucose-Capillary: 130 mg/dL — ABNORMAL HIGH (ref 65–99)

## 2015-09-06 MED ORDER — DIPHENHYDRAMINE HCL 12.5 MG/5ML PO ELIX: 12.5000 mg | ORAL_SOLUTION | Freq: Four times a day (QID) | ORAL | Status: DC | PRN

## 2015-09-06 MED ORDER — ATORVASTATIN CALCIUM 10 MG PO TABS: 10.0000 mg | ORAL_TABLET | Freq: Every day | ORAL | Status: DC

## 2015-09-06 MED ORDER — TRAMADOL HCL 50 MG PO TABS
100.0000 mg | ORAL_TABLET | ORAL | Status: DC | PRN
  Administered 2015-09-06 – 2015-09-08 (×8): 100 mg via ORAL
  Filled 2015-09-06 (×8): qty 2

## 2015-09-06 MED ORDER — DIPHENHYDRAMINE HCL 50 MG/ML IJ SOLN: 12.5000 mg | Freq: Four times a day (QID) | INTRAMUSCULAR | Status: DC | PRN

## 2015-09-06 MED ORDER — TRAMADOL HCL 50 MG PO TABS: 50.0000 mg | ORAL_TABLET | Freq: Four times a day (QID) | ORAL | Status: DC | PRN

## 2015-09-06 MED ORDER — NALOXONE HCL 0.4 MG/ML IJ SOLN: 0.4000 mg | INTRAMUSCULAR | Status: DC | PRN

## 2015-09-06 MED ORDER — DIPHENHYDRAMINE HCL 12.5 MG/5ML PO ELIX
12.5000 mg | ORAL_SOLUTION | Freq: Four times a day (QID) | ORAL | Status: DC | PRN
  Administered 2015-09-07: 12.5 mg via ORAL
  Filled 2015-09-06: qty 10

## 2015-09-06 MED ORDER — MORPHINE SULFATE (PF) 2 MG/ML IV SOLN
2.0000 mg | INTRAVENOUS | Status: DC | PRN
  Administered 2015-09-06 (×2): 3 mg via INTRAVENOUS
  Filled 2015-09-06 (×2): qty 2

## 2015-09-06 MED ORDER — DIPHENHYDRAMINE HCL 50 MG/ML IJ SOLN
12.5000 mg | Freq: Four times a day (QID) | INTRAMUSCULAR | Status: DC | PRN
  Administered 2015-09-06: 12.5 mg via INTRAVENOUS
  Filled 2015-09-06: qty 1

## 2015-09-06 MED ORDER — ATORVASTATIN CALCIUM 10 MG PO TABS
10.0000 mg | ORAL_TABLET | Freq: Every day | ORAL | Status: DC
  Administered 2015-09-06 – 2015-09-07 (×2): 10 mg via ORAL
  Filled 2015-09-06 (×2): qty 1

## 2015-09-06 MED ORDER — MORPHINE SULFATE 2 MG/ML IV SOLN: INTRAVENOUS | Status: DC

## 2015-09-06 MED ORDER — ONDANSETRON HCL 4 MG/2ML IJ SOLN: 4.0000 mg | Freq: Four times a day (QID) | INTRAMUSCULAR | Status: DC | PRN

## 2015-09-06 MED ORDER — SODIUM CHLORIDE 0.9% FLUSH: 9.0000 mL | INTRAVENOUS | Status: DC | PRN

## 2015-09-06 NOTE — Progress Notes (Signed)
Subjective: Interval History: none.. Comfortable. No nausea or vomiting. Has been up walking in the hall some. Unfortunately he has not been offered tramadol which is been written for several days. Is continuing to rely on his PCA occasionally.  Objective: Vital signs in last 24 hours: Temp:  [98.1 F (36.7 C)-99.5 F (37.5 C)] 98.8 F (37.1 C) (05/27 0355) Pulse Rate:  [82-89] 89 (05/27 0355) Resp:  [16-20] 16 (05/27 0800) BP: (126-135)/(67-70) 126/70 mmHg (05/27 0355) SpO2:  [91 %-100 %] 92 % (05/27 0800)  Intake/Output from previous day: 05/26 0701 - 05/27 0700 In: -  Out: 250 [Urine:250] Intake/Output this shift:    Abdomen soft and nontender. Mild distention. 2-3+ femoral pulses with a well-healing groin incisions. 2+ right posterior tibial pulse and well-perfused left foot.  Lab Results:  Recent Labs  09/04/15 0342  WBC 11.2*  HGB 12.0*  HCT 37.1*  PLT 156   BMET  Recent Labs  09/04/15 0342  NA 133*  K 4.2  CL 100*  CO2 27  GLUCOSE 153*  BUN <5*  CREATININE 0.85  CALCIUM 8.3*    Studies/Results: Dg Chest Port 1 View  09/02/2015  CLINICAL DATA:  Aortobifemoral bypass. EXAM: PORTABLE CHEST 1 VIEW COMPARISON:  09/01/2015 . FINDINGS: Swan-Ganz catheter and NG tube in stable position. Low lung volumes with mild bibasilar atelectasis. Heart size normal. Tiny left pleural effusion cannot be excluded. No pneumothorax. IMPRESSION: 1.  NG tube and Swan-Ganz catheter in stable position. 2. Low lung volumes with mild bibasilar atelectasis. Small left pleural effusion cannot be excluded. Electronically Signed   By: Alfred Holmes  Register   On: 09/02/2015 07:48   Dg Chest Port 1 View  09/01/2015  CLINICAL DATA:  Status post aortobifemoral bypass surgery EXAM: PORTABLE CHEST 1 VIEW COMPARISON:  None. FINDINGS: Cardiac shadow is mildly prominent accentuated by the portable technique. A nasogastric catheter and Swan-Ganz catheter are noted in satisfactory position. The lungs are  hypo aerated but no focal infiltrate is seen. No pneumothorax is noted. No bony abnormality is seen. IMPRESSION: Poor inspiratory effort. Tubes and lines as described. Electronically Signed   By: Alfred CleverMark  Lukens M.D.   On: 09/01/2015 14:01   Dg Abd Portable 1v  09/01/2015  CLINICAL DATA:  Status post aortobifemoral bypass surgery. EXAM: PORTABLE ABDOMEN - 1 VIEW COMPARISON:  None. FINDINGS: The bowel gas pattern is normal. No radio-opaque calculi or other significant radiographic abnormality are seen. Surgical clips are seen within mid abdomen. IMPRESSION: Nonobstructive bowel gas pattern. Electronically Signed   By: Alfred Mcalpineobrinka  Dimitrova M.D.   On: 09/01/2015 13:58   Anti-infectives: Anti-infectives    Start     Dose/Rate Route Frequency Ordered Stop   09/02/15 0000  cefUROXime (ZINACEF) 1.5 g in dextrose 5 % 50 mL IVPB     1.5 g 100 mL/hr over 30 Minutes Intravenous Every 12 hours 09/01/15 1417 09/02/15 1223   09/01/15 1215  cefUROXime (ZINACEF) 1.5 g in dextrose 5 % 50 mL IVPB  Status:  Discontinued     1.5 g 100 mL/hr over 30 Minutes Intravenous To Surgery 09/01/15 1201 09/01/15 1412   09/01/15 0600  cefUROXime (ZINACEF) 1.5 g in dextrose 5 % 50 mL IVPB     1.5 g 100 mL/hr over 30 Minutes Intravenous To ShortStay Surgical 08/31/15 1416 09/01/15 1200      Assessment/Plan: s/p Procedure(s): AORTOBIFEMORAL BYPASS GRAFT (Bilateral) Stable postop day 5. Will DC PCA. Will continue to mobilize today. Possible discharge in a.m. if comfortable ambulating.  LOS: 5 days   Alfred Holmes 09/06/2015, 10:12 AM

## 2015-09-06 NOTE — Progress Notes (Signed)
Pt concerned about pain control over night.  Call placed to Dr. Arbie CookeyEarly.  Verbal order received to increase dose and frequency of tramadol.  Order entered.  Pt education given.  Will cont to monitor.

## 2015-09-06 NOTE — Discharge Summary (Signed)
AAA Discharge Summary    Alfred Holmes 11/15/1950 65 y.o. male  [id number]  Admission Date: 09/01/2015  Discharge Date: 09/08/15  Physician: Larina Earthlyodd F Early, MD  Admission Diagnosis: Peripheral vascular disease with left lower extremity rest pain I70.222   HPI:   This is a 65 y.o. male Patient seen today for severe lower from the arterial insufficiency. He is a very pleasant 65 year old gentleman has had progressive symptoms of pain and numbness in both lower extremities. He reports that crap calf cramping with minimal walking and also reports that he has been unable to sleep due to his left foot waking him up at night. He reports that he is able to walk a short distance have some relief going to back to bed view immediately awakened again with severe pain. Fortunately has had no tissue loss.  Hospital Course:  The patient was admitted to the hospital and taken to the operating room on 09/01/2015 and underwent: Aortobifemoral bypass grafting with 14 x 8 Hemashield graft  The pt tolerated the procedure well and was transported to the PACU in good condition. In the recovery room, the pt was Reporting left foot numbness which is comparable to pre-op.   Audible doppler flow left PT, foot is warm and pink. Palpable right PT pulse. Abdomen mildly distended, but soft. Per nursing staff, laparotomy dressing reinforced due to bleeding. Bandage is clean upon inspection. Right groin dressing with moderate drainage. Left dressing clean.   Stable post-op. Hypertension now stable. Good UOP.   By POD 1, he was doing well.  His NGT was discontinued.  A PCA was started for pain control.  He was transferred to 2 west.  By POD 2, he was passing flatus but did have positive bowel sounds.  Clear liquids were started.    By POD 3, he had pain and anxiety the night before and also some itching and benadryl was given.  He is encouraged to mobilize and incentive spirometry.  He was given a suppository.  By  POD 5, his PCA was discontinued and he was started on Tramadol.  His dose subsequently was increased.  He did tolerate full liquids and was advanced to a regular diet.  He did tolerate this.  He is also started on a statin.  Groin wound care was discussed with the pt a couple of times.   By POD 7, he was doing well.  He had a BM, his air exchange in the bases of his lungs has improved and he will be discharged home.  The remainder of the hospital course consisted of increasing mobilization and increasing intake of solids without difficulty.  CBC    Component Value Date/Time   WBC 11.2* 09/04/2015 0342   RBC 3.92* 09/04/2015 0342   HGB 12.0* 09/04/2015 0342   HCT 37.1* 09/04/2015 0342   PLT 156 09/04/2015 0342   MCV 94.6 09/04/2015 0342   MCH 30.6 09/04/2015 0342   MCHC 32.3 09/04/2015 0342   RDW 12.6 09/04/2015 0342    BMET    Component Value Date/Time   NA 133* 09/04/2015 0342   K 4.2 09/04/2015 0342   CL 100* 09/04/2015 0342   CO2 27 09/04/2015 0342   GLUCOSE 153* 09/04/2015 0342   BUN <5* 09/04/2015 0342   CREATININE 0.85 09/04/2015 0342   CALCIUM 8.3* 09/04/2015 0342   GFRNONAA >60 09/04/2015 0342   GFRAA >60 09/04/2015 0342     Discharge Instructions    ABDOMINAL PROCEDURE/ANEURYSM REPAIR/AORTO-BIFEMORAL BYPASS:  Call MD for  increased abdominal pain; cramping diarrhea; nausea/vomiting    Complete by:  As directed      Call MD for:  redness, tenderness, or signs of infection (pain, swelling, bleeding, redness, odor or green/yellow discharge around incision site)    Complete by:  As directed      Call MD for:  severe or increased pain, loss or decreased feeling  in affected limb(s)    Complete by:  As directed      Call MD for:  temperature >100.5    Complete by:  As directed      Discharge wound care:    Complete by:  As directed   Wash the groin wound with soap and water daily and pat dry. (No tub bath-only shower)  Then put a dry gauze or washcloth there to keep  this area dry daily and as needed.  Do not use Vaseline or neosporin on your incisions.  Only use soap and water on your incisions and then protect and keep dry.     Driving Restrictions    Complete by:  As directed   No driving for 2 weeks     Lifting restrictions    Complete by:  As directed   No lifting for 4 weeks     Resume previous diet    Complete by:  As directed            Discharge Diagnosis:  Peripheral vascular disease with left lower extremity rest pain I70.222  Secondary Diagnosis: Patient Active Problem List   Diagnosis Date Noted  . Aortoiliac occlusive disease (HCC) 09/01/2015   Past Medical History  Diagnosis Date  . Peripheral arterial disease (HCC)   . Coronary artery disease     2005 3 cornary stents  followed by [facility]  Dr. Dulce Sellar  . Anxiety     history of panic attacks  . GERD (gastroesophageal reflux disease)     OTC tums if needed  . Neuromuscular disorder (HCC)     numbness in left leg  due to circulation problems       Medication List    TAKE these medications        aspirin 81 MG tablet  Take 81 mg by mouth daily.     atorvastatin 10 MG tablet  Commonly known as:  LIPITOR  Take 1 tablet (10 mg total) by mouth daily.     traMADol 50 MG tablet  Commonly known as:  ULTRAM  Take 1 tablet (50 mg total) by mouth every 6 (six) hours as needed.        Prescriptions given: 1.  Tramadol #30 No Refill 2.  Lipitor  daily #30 2 refills (refills per cardiology or PCP)  Instructions: 1.  Wash the groin wound with soap and water daily and pat dry. (No tub bath-only shower)  Then put a dry gauze or washcloth there to keep this area dry daily and as needed.  Do not use Vaseline or neosporin on your incisions.  Only use soap and water on your incisions and then protect and keep dry.   Disposition: home  Patient's condition: is Good  Follow up: 1. Dr. Arbie Cookey in 2 weeks   Doreatha Massed, PA-C [facility] [phone] 09/06/2015  10:43 AM   - For VQI Registry use ---   Post-op:  Time to Extubation:  In OR,  < 12 hrs,  12-24 hrs,  >=24 hrs Vasopressors Req. Post-op: No ICU Stay: 1  day in ICU Transfusion: No  If yes, n/a units given MI: No, [ ]  Troponin only, [ ]  EKG or Clinical New Arrhythmia: No  Complications: CHF: No Resp failure: No, [ ]  Pneumonia, [ ]  Ventilator Chg in renal function: No, [ ]  Inc. Cr > 0.5, [ ]  Temp. Dialysis, [ ]  Permanent dialysis Leg ischemia: No, no Surgery needed, [ ]  Yes, Surgery needed, [ ]  Amputation Bowel ischemia: No, [ ]  Medical Rx, [ ]  Surgical Rx Wound complication: No, [ ]  Superficial separation/infection, [ ]  Return to OR Return to OR: No  Return to OR for bleeding: No Stroke: No, [ ]  Minor, [ ]  Major  Discharge medications: Statin use:  Yes If No: [ ]  For Medical reasons, [ ]  Non-compliant ASA use:  Yes  If No: [ ]  For Medical reasons, [ ]  Non-compliant Plavix use:  No If No: [ ]  For Medical reasons, [ ]  Non-compliant Beta blocker use:  No If No: [ ]  For Medical reasons, [ ]  Non-compliant ACEI use:  No ARB use:  No

## 2015-09-06 NOTE — Progress Notes (Addendum)
  AAA Progress Note    09/06/2015 8:58 AM 5 Days Post-Op  Subjective:  C/o soreness and pain at incision site  Tm 99.5 now 98.8 HR 80's 120's-130's systolic 91% 2LO2NC  Filed Vitals:   09/06/15 0000 09/06/15 0355  BP:  126/70  Pulse:  89  Temp:  98.8 F (37.1 C)  Resp: 17 18    Physical Exam: Cardiac:  Regular Lungs:  Continues with decreased BS at bases, but slightly improved on the right Abdomen:  Soft, NT to palpation; +BS Incisions:  All incisions are clean and dry  Extremities:  Bilateral feet are warm and well perfused.  CBC    Component Value Date/Time   WBC 11.2* 09/04/2015 0342   RBC 3.92* 09/04/2015 0342   HGB 12.0* 09/04/2015 0342   HCT 37.1* 09/04/2015 0342   PLT 156 09/04/2015 0342   MCV 94.6 09/04/2015 0342   MCH 30.6 09/04/2015 0342   MCHC 32.3 09/04/2015 0342   RDW 12.6 09/04/2015 0342    BMET    Component Value Date/Time   NA 133* 09/04/2015 0342   K 4.2 09/04/2015 0342   CL 100* 09/04/2015 0342   CO2 27 09/04/2015 0342   GLUCOSE 153* 09/04/2015 0342   BUN <5* 09/04/2015 0342   CREATININE 0.85 09/04/2015 0342   CALCIUM 8.3* 09/04/2015 0342   GFRNONAA >60 09/04/2015 0342   GFRAA >60 09/04/2015 0342    INR    Component Value Date/Time   INR 1.18 09/01/2015 1245     Intake/Output Summary (Last 24 hours) at 09/06/15 0858 Last data filed at 09/05/15 1100  Gross per 24 hour  Intake      0 ml  Output    250 ml  Net   -250 ml     Assessment/Plan:  65 y.o. male is s/p  Aortobifemoral bypass grafting 5 Days Post-Op  -pt states he is a little better this am-says he has been working more on his IS and his BS at the right base sounds a little better this morning.  He says he is getting out of bed more. -discussed the PCA with the pt and starting Tramadol to wean the PCA.  Spoke to the nurse and pt is getting a dose from the PCA every time he is able.  Will reduce the dose of morphine dose in the PCA and start Tramadol.  Discussed  with pt the more he gets up and moves and walks, this will also help with the soreness.  Also stressed to him that no pain medication is going to make the pain completely go away. -tolerated full liquids yesterday and has good BS-will advance diet to regular -dry gauze to bilateral groins to wick moisture to help prevent wound infection -start statin   Doreatha MassedSamantha Jocsan Mcginley, PA-C [facility] [phone] 09/06/2015 8:58 AM

## 2015-09-06 NOTE — Progress Notes (Deleted)
U/a ordered yesterday given malodorous smell and positive u/a last month.  It is slightly improved. Discussed with Dr. Arbie CookeyEarly and he is in favor of treating the UTI given the pt does have synthetic graft material in place for her femoral to femoral bypass grafting.  cipro 500mg  bid started.  Doreatha MassedSamantha Bascom Biel, [facility] 09/06/2015 10:29 AM

## 2015-09-07 LAB — GLUCOSE, CAPILLARY
GLUCOSE-CAPILLARY: 103 mg/dL — AB (ref 65–99)
Glucose-Capillary: 105 mg/dL — ABNORMAL HIGH (ref 65–99)
Glucose-Capillary: 107 mg/dL — ABNORMAL HIGH (ref 65–99)
Glucose-Capillary: 116 mg/dL — ABNORMAL HIGH (ref 65–99)
Glucose-Capillary: 136 mg/dL — ABNORMAL HIGH (ref 65–99)

## 2015-09-07 MED ORDER — BISACODYL 10 MG RE SUPP
10.0000 mg | Freq: Once | RECTAL | Status: AC
  Administered 2015-09-07: 10 mg via RECTAL

## 2015-09-07 MED ORDER — MAGNESIUM HYDROXIDE 400 MG/5ML PO SUSP
30.0000 mL | Freq: Every day | ORAL | Status: DC | PRN
  Administered 2015-09-07: 30 mL via ORAL
  Filled 2015-09-07: qty 30

## 2015-09-07 NOTE — Progress Notes (Signed)
Subjective: Interval History: none.. No complaints. Better pain control. No bowel movements in several days. reports numbness in his feet have completely resolved  Objective: Vital signs in last 24 hours: Temp:  [98.3 F (36.8 C)-98.7 F (37.1 C)] 98.3 F (36.8 C) (05/28 0416) Pulse Rate:  [81-86] 81 (05/28 0416) Resp:  [20-21] 20 (05/28 0416) BP: (132-135)/(68-73) 135/73 mmHg (05/28 0416) SpO2:  [91 %-93 %] 91 % (05/28 0416)  Intake/Output from previous day: 05/27 0701 - 05/28 0700 In: 600 [P.O.:600] Out: -  Intake/Output this shift: Total I/O In: 240 [P.O.:240] Out: -   Abdomen soft and nontender. Groin incisions healing with 2+ femoral pulses  Lab Results: No results for input(s): WBC, HGB, HCT, PLT in the last 72 hours. BMET No results for input(s): NA, K, CL, CO2, GLUCOSE, BUN, CREATININE, CALCIUM in the last 72 hours.  Studies/Results: Dg Chest Port 1 View  09/02/2015  CLINICAL DATA:  Aortobifemoral bypass. EXAM: PORTABLE CHEST 1 VIEW COMPARISON:  09/01/2015 . FINDINGS: Swan-Ganz catheter and NG tube in stable position. Low lung volumes with mild bibasilar atelectasis. Heart size normal. Tiny left pleural effusion cannot be excluded. No pneumothorax. IMPRESSION: 1.  NG tube and Swan-Ganz catheter in stable position. 2. Low lung volumes with mild bibasilar atelectasis. Small left pleural effusion cannot be excluded. Electronically Signed   By: Maisie Fushomas  Register   On: 09/02/2015 07:48   Dg Chest Port 1 View  09/01/2015  CLINICAL DATA:  Status post aortobifemoral bypass surgery EXAM: PORTABLE CHEST 1 VIEW COMPARISON:  None. FINDINGS: Cardiac shadow is mildly prominent accentuated by the portable technique. A nasogastric catheter and Swan-Ganz catheter are noted in satisfactory position. The lungs are hypo aerated but no focal infiltrate is seen. No pneumothorax is noted. No bony abnormality is seen. IMPRESSION: Poor inspiratory effort. Tubes and lines as described. Electronically  Signed   By: Alcide CleverMark  Lukens M.D.   On: 09/01/2015 14:01   Dg Abd Portable 1v  09/01/2015  CLINICAL DATA:  Status post aortobifemoral bypass surgery. EXAM: PORTABLE ABDOMEN - 1 VIEW COMPARISON:  None. FINDINGS: The bowel gas pattern is normal. No radio-opaque calculi or other significant radiographic abnormality are seen. Surgical clips are seen within mid abdomen. IMPRESSION: Nonobstructive bowel gas pattern. Electronically Signed   By: Ted Mcalpineobrinka  Dimitrova M.D.   On: 09/01/2015 13:58   Anti-infectives: Anti-infectives    Start     Dose/Rate Route Frequency Ordered Stop   09/02/15 0000  cefUROXime (ZINACEF) 1.5 g in dextrose 5 % 50 mL IVPB     1.5 g 100 mL/hr over 30 Minutes Intravenous Every 12 hours 09/01/15 1417 09/02/15 1223   09/01/15 1215  cefUROXime (ZINACEF) 1.5 g in dextrose 5 % 50 mL IVPB  Status:  Discontinued     1.5 g 100 mL/hr over 30 Minutes Intravenous To Surgery 09/01/15 1201 09/01/15 1412   09/01/15 0600  cefUROXime (ZINACEF) 1.5 g in dextrose 5 % 50 mL IVPB     1.5 g 100 mL/hr over 30 Minutes Intravenous To ShortStay Surgical 08/31/15 1416 09/01/15 1200      Assessment/Plan: s/p Procedure(s): AORTOBIFEMORAL BYPASS GRAFT (Bilateral) Able overall. I did continue to require IV narcotics on several occasions last night. We will give Dulcolax suppository today. Will continue to mobilize and encourage by mouth only meds. Plan discharge in a.m.   LOS: 6 days   Martena Emanuele 09/07/2015, 9:58 AM

## 2015-09-08 LAB — CREATININE, SERUM: Creatinine, Ser: 0.85 mg/dL (ref 0.61–1.24)

## 2015-09-08 LAB — GLUCOSE, CAPILLARY
GLUCOSE-CAPILLARY: 137 mg/dL — AB (ref 65–99)
GLUCOSE-CAPILLARY: 94 mg/dL (ref 65–99)
Glucose-Capillary: 128 mg/dL — ABNORMAL HIGH (ref 65–99)

## 2015-09-08 MED ORDER — TRAMADOL HCL 50 MG PO TABS: 50.0000 mg | ORAL_TABLET | Freq: Four times a day (QID) | ORAL | Status: DC | PRN

## 2015-09-08 MED ORDER — ATORVASTATIN CALCIUM 10 MG PO TABS: 10.0000 mg | ORAL_TABLET | Freq: Every day | ORAL | Status: DC

## 2015-09-08 NOTE — Progress Notes (Addendum)
  AAA Progress Note    09/08/2015 8:12 AM 7 Days Post-Op  Subjective:  "I'm ready to go home"  Afebrile HR  70's NSR 130's-140's systolic 96% RA  Filed Vitals:   09/07/15 2028 09/08/15 0358  BP: 134/70 147/75  Pulse: 73 72  Temp: 98.2 F (36.8 C) 98.2 F (36.8 C)  Resp: 18 16    Physical Exam: Cardiac:  regular Lungs:  CTAB (much better air movement from 2 days ago) Abdomen:  Soft, NT/ND +BS; +BM Incisions:  Clean and dry with steri strips in place Extremities:  Bilateral feet are warm and well perfused.  CBC    Component Value Date/Time   WBC 11.2* 09/04/2015 0342   RBC 3.92* 09/04/2015 0342   HGB 12.0* 09/04/2015 0342   HCT 37.1* 09/04/2015 0342   PLT 156 09/04/2015 0342   MCV 94.6 09/04/2015 0342   MCH 30.6 09/04/2015 0342   MCHC 32.3 09/04/2015 0342   RDW 12.6 09/04/2015 0342    BMET    Component Value Date/Time   NA 133* 09/04/2015 0342   K 4.2 09/04/2015 0342   CL 100* 09/04/2015 0342   CO2 27 09/04/2015 0342   GLUCOSE 153* 09/04/2015 0342   BUN <5* 09/04/2015 0342   CREATININE 0.85 09/08/2015 0221   CALCIUM 8.3* 09/04/2015 0342   GFRNONAA >60 09/08/2015 0221   GFRAA >60 09/08/2015 0221    INR    Component Value Date/Time   INR 1.18 09/01/2015 1245     Intake/Output Summary (Last 24 hours) at 09/08/15 [id number] Last data filed at 09/07/15 1700  Gross per 24 hour  Intake    720 ml  Output      0 ml  Net    720 ml     Assessment/Plan:  65 y.o. male is s/p  Aortobifemoral bypass grafting 7 Days Post-Op  -pt doing quite well today and is ready to go home -his pain is reasonably managed -had BM last evening -air exchange at lung bases has also improved  -discharge home today and f/u with Dr. Arbie CookeyEarly in 2 weeks. -encouraged him to continue his IS at home -discussed groin wound care with pt and he and his family member expressed understanding.   Doreatha MassedSamantha Rhyne, PA-C [facility] [phone] 09/08/2015 8:12 AM  I  have examined the patient, reviewed and agree with above.  Gretta BeganEarly, Zierra Laroque, MD 09/08/2015 9:52 AM

## 2015-09-08 NOTE — Care Management Important Message (Signed)
Important Message  Patient Details  Name: [name] MRN: [id number] Date of Birth: 1950-12-10   Medicare Important Message Given:  Yes    Shontay Wallner P Elya Diloreto 09/08/2015, 9:44 AM

## 2015-09-08 NOTE — Progress Notes (Signed)
Occupational Therapy Treatment Patient Details Name: Davone Shinault MRN: [id number] DOB: [birth year] Today's Date: 09/08/2015    History of present illness s/p aortobifemoral bypass 09/01/15   OT comments  Pt progressing well; simulated tub transfer and options given  Follow Up Recommendations  No OT follow up;Supervision/Assistance - 24 hour    Equipment Recommendations       Recommendations for Other Services      Precautions / Restrictions Precautions Precautions: Fall Restrictions Weight Bearing Restrictions: No       Mobility Bed Mobility     Rolling: Supervision Sidelying to sit: Min guard     Sit to sidelying: Min assist General bed mobility comments: pt initially had family member assist with getting up with semi-log roll. Educated on roll to minimize pain  Transfers   Equipment used: 1 person hand held assist Transfers: Sit to/from Stand Sit to Stand: Min guard         General transfer comment: for safety.  Pt states he is using RW for distances only    Balance                                   ADL       Grooming: Oral care;Supervision/safety;Standing                           Tub/ Shower Transfer: Tub transfer;Min guard;Ambulation     General ADL Comments: simulated tub transfer--sidestepping in and bending knee to kick leg backwards. Also educated on placing seat in tub facing out and sitting back on this and supporting legs outside of tub with trash cans to support (and cutting shower liner to keep water in tub).  Reinforced need to take frequent rest breaks for energy conservation      Vision                     Perception     Praxis      Cognition   Behavior During Therapy: [facility] for tasks assessed/performed Overall Cognitive Status: Within Functional Limits for tasks assessed                       Extremity/Trunk Assessment               Exercises     Shoulder Instructions        General Comments      Pertinent Vitals/ Pain       Pain Assessment: Faces Faces Pain Scale: Hurts little more Pain Location: incisions with movement Pain Intervention(s): Limited activity within patient's tolerance;Monitored during session;Premedicated before session;Repositioned  Home Living                                          Prior Functioning/Environment              Frequency       Progress Toward Goals  OT Goals(current goals can now be found in the care plan section)  Progress towards OT goals: Progressing toward goals  Acute Rehab OT Goals Time For Goal Achievement: 09/11/15  Plan      Co-evaluation                 End of Session  Activity Tolerance Patient tolerated treatment well   Patient Left in bed;with call bell/phone within reach;with family/visitor present   Nurse Communication          Time: [id number] OT Time Calculation (min): 13 min  Charges: OT General Charges $OT Visit: 1 Procedure OT Treatments $Self Care/Home Management : 8-22 mins  Yulia Ulrich 09/08/2015, 7:49 AM  Marica OtterMaryellen Tajuana Kniskern, OTR/L [phone] 09/08/2015

## 2015-09-08 NOTE — Progress Notes (Signed)
Patient to D/C home with wife. IV removed. Tele monitor. CCMD notifed. Patient education done. Education on incision care done. Signs and symptoms of incision. To D/C home with wife.   Valinda HoarLexie Deshanta Lady RN

## 2015-09-18 ENCOUNTER — Encounter: Payer: Self-pay | Admitting: Vascular Surgery

## 2015-09-23 ENCOUNTER — Ambulatory Visit (INDEPENDENT_AMBULATORY_CARE_PROVIDER_SITE_OTHER): Payer: Medicare Other | Admitting: Vascular Surgery

## 2015-09-23 ENCOUNTER — Encounter: Payer: Self-pay | Admitting: Vascular Surgery

## 2015-09-23 VITALS — BP 144/93 | HR 75 | Ht 68.0 in | Wt 168.6 lb

## 2015-09-23 DIAGNOSIS — I7409 Other arterial embolism and thrombosis of abdominal aorta: Secondary | ICD-10-CM

## 2015-09-23 DIAGNOSIS — I739 Peripheral vascular disease, unspecified: Secondary | ICD-10-CM

## 2015-09-23 NOTE — Progress Notes (Signed)
   Patient name: Alfred Holmes MRN: [id number] DOB: 20-Feb-1951 Sex: male  REASON FOR VISIT: Follow-up recent aortobifemoral bypass for occlusive disease  HPI: Alfred Holmes is a 65 y.o. male had presented with critical limb ischemia with aortic occlusion. Underwent aortobifemoral bypass with a 14 x 8 Hemashield graft on 09/01/2015. He did well the hospital was discharged home with no complications. He has done well at home. He is resuming his usual stamina. He reports that he is having some trouble with constipation but is treating this with stool softener and laxative. Does report his appetite is returning as well.  Current Outpatient Prescriptions  Medication Sig Dispense Refill  . aspirin 81 MG tablet Take 81 mg by mouth daily.    Marland Kitchen. atorvastatin (LIPITOR) 10 MG tablet Take 1 tablet (10 mg total) by mouth daily. 30 tablet 2  . traMADol (ULTRAM) 50 MG tablet Take 1-2 tablets (50-100 mg total) by mouth every 6 (six) hours as needed. 30 tablet 0   No current facility-administered medications for this visit.      PHYSICAL EXAM: Filed Vitals:   09/23/15 1134 09/23/15 1136  BP: 147/93 144/93  Pulse: 75   Height: 5\' 8"  (1.727 m)   Weight: 168 lb 9.6 oz (76.476 kg)   SpO2: 98%     GENERAL: The patient is a well-nourished male, in no acute distress. The vital signs are documented above. Abdomen soft and nontender. Midline incision is healing quite nicely. Both groin incisions also healing with 2+ femoral pulses bilaterally. Absent popliteal and pedal pulses bilaterally  MEDICAL ISSUES: Stable status post aortobifemoral bypass for occlusive disease. Known SFA disease as well. Claudication symptoms currently. We'll continue his usual activity. Will restrict lifting to 15-20 pounds in no activity that has stopped heavy straining on his abdominal wall. We'll see him again in 3 months with ankle arm index  Larina Earthlyodd F. Latise Dilley, MD [facility] Office Tel [phone] Pager [phone]

## 2015-12-25 ENCOUNTER — Encounter: Payer: Self-pay | Admitting: Vascular Surgery

## 2015-12-27 ENCOUNTER — Other Ambulatory Visit: Payer: Self-pay | Admitting: *Deleted

## 2015-12-27 DIAGNOSIS — Z48812 Encounter for surgical aftercare following surgery on the circulatory system: Secondary | ICD-10-CM

## 2015-12-27 DIAGNOSIS — I739 Peripheral vascular disease, unspecified: Secondary | ICD-10-CM

## 2015-12-30 ENCOUNTER — Other Ambulatory Visit: Payer: Self-pay | Admitting: Physician Assistant

## 2015-12-30 ENCOUNTER — Encounter: Payer: Self-pay | Admitting: Vascular Surgery

## 2015-12-30 ENCOUNTER — Ambulatory Visit (INDEPENDENT_AMBULATORY_CARE_PROVIDER_SITE_OTHER): Payer: Medicare Other | Admitting: Vascular Surgery

## 2015-12-30 ENCOUNTER — Ambulatory Visit (HOSPITAL_COMMUNITY)
Admission: RE | Admit: 2015-12-30 | Discharge: 2015-12-30 | Disposition: A | Discharge status: Home / Self Care | Payer: Medicare Other | Source: Ambulatory Visit | Attending: Vascular Surgery | Admitting: Vascular Surgery

## 2015-12-30 VITALS — BP 155/90 | HR 80 | Ht 68.0 in | Wt 167.9 lb

## 2015-12-30 DIAGNOSIS — Z48812 Encounter for surgical aftercare following surgery on the circulatory system: Secondary | ICD-10-CM | POA: Diagnosis present

## 2015-12-30 DIAGNOSIS — I739 Peripheral vascular disease, unspecified: Secondary | ICD-10-CM

## 2015-12-30 DIAGNOSIS — I7409 Other arterial embolism and thrombosis of abdominal aorta: Secondary | ICD-10-CM | POA: Diagnosis not present

## 2015-12-30 NOTE — Progress Notes (Signed)
Patient name: Alfred Holmes MRN: [id number] DOB: Apr 16, 1950 Sex: male  REASON FOR VISIT: Follow-up of aortobifemoral bypass 09/01/2015  HPI: Alfred Holmes is a 65 y.o. male here today for follow-up. He looks quite good today. He reports that he is returned to his usual stamina and his usual weight. He is having normal bowel movements and normal appetite with no abdominal discomfort. He reports that he remains active and walks and denies any claudication symptoms right or left.  Past Medical History:  Diagnosis Date  . Anxiety    history of panic attacks  . Coronary artery disease    2005 3 cornary stents  followed by [facility]  Dr. Dulce Sellar  . GERD (gastroesophageal reflux disease)    OTC tums if needed  . Neuromuscular disorder (HCC)    numbness in left leg  due to circulation problems  . Peripheral arterial disease (HCC)     Family History  Problem Relation Age of Onset  . Heart disease Mother   . Heart disease Father     SOCIAL HISTORY: Social History  Substance Use Topics  . Smoking status: Current Every Day Smoker    Packs/day: 1.00    Years: 40.00    Types: Cigarettes  . Smokeless tobacco: Never Used  . Alcohol use No    Allergies  Allergen Reactions  . Codeine Other (See Comments)    "makes me feel crazy"    Current Outpatient Prescriptions  Medication Sig Dispense Refill  . aspirin 81 MG tablet Take 81 mg by mouth daily.    Marland Kitchen atorvastatin (LIPITOR) 10 MG tablet Take 1 tablet (10 mg total) by mouth daily. 30 tablet 2  . traMADol (ULTRAM) 50 MG tablet Take 1-2 tablets (50-100 mg total) by mouth every 6 (six) hours as needed. (Patient not taking: Reported on 12/30/2015) 30 tablet 0   No current facility-administered medications for this visit.     REVIEW OF SYSTEMS:  [X]  denotes positive finding, [ ]  denotes negative finding Cardiac  Comments:  Chest pain or chest pressure:     Shortness of breath upon exertion:    Short of breath when lying flat:     Irregular heart rhythm:        Vascular    Pain in calf, thigh, or hip brought on by ambulation:    Pain in feet at night that wakes you up from your sleep:     Blood clot in your veins:    Leg swelling:         Pulmonary    Oxygen at home:    Productive cough:     Wheezing:         Neurologic    Sudden weakness in arms or legs:     Sudden numbness in arms or legs:     Sudden onset of difficulty speaking or slurred speech:    Temporary loss of vision in one eye:     Problems with dizziness:         Gastrointestinal    Blood in stool:     Vomited blood:         Genitourinary    Burning when urinating:     Blood in urine:        Psychiatric    Major depression:         Hematologic    Bleeding problems:    Problems with blood clotting too easily:        Skin    Rashes  or ulcers:        Constitutional    Fever or chills:      PHYSICAL EXAM: Vitals:   12/30/15 1047 12/30/15 1048  BP: (!) 149/94 (!) 155/90  Pulse: 80   SpO2: 99%   Weight: 167 lb 14.4 oz (76.2 kg)   Height: 5\' 8"  (1.727 m)     GENERAL: The patient is a well-nourished male, in no acute distress. The vital signs are documented above.  VASCULAR: 2+ radial and 2+ femoral pulses bilaterally. No evidence of false aneurysm in his femoral and anastomoses. I do not palpate pedal pulses bilaterally PULMONARY: There is good air exchange ABDOMEN: Soft and non-tender well-healed healed midline incision and no hernia or masses noted MUSCULOSKELETAL: There are no major deformities or cyanosis. NEUROLOGIC: No focal weakness or paresthesias are detected. SKIN: There are no ulcers or rashes noted. PSYCHIATRIC: The patient has a normal affect.  DATA:   Ankle arm index and normal with triphasic waveforms on the right. Interestingly his ankle arm indexes 0.25 on the left with monophasic flow.  MEDICAL ISSUES:  Stable status post aortobifemoral bypass in May of this year. He does have known superficial  femoral artery occlusive disease. He is completely asymptomatic. He will notify should he develop any claudication symptoms. Otherwise we'll see him again in 6 months with repeat ankle arm indices    Early, Todd [facility] [place] [facility] [facility] [phone]

## 2016-01-25 ENCOUNTER — Other Ambulatory Visit: Payer: Self-pay | Admitting: Physician Assistant

## 2016-03-18 NOTE — Addendum Note (Signed)
Addended by: Burton ApleyPETTY, Chandon Lazcano A on: 03/18/2016 09:27 AM   Modules accepted: Orders

## 2016-06-24 ENCOUNTER — Encounter: Payer: Self-pay | Admitting: Vascular Surgery

## 2016-07-06 ENCOUNTER — Ambulatory Visit (HOSPITAL_COMMUNITY): Payer: Medicare Other

## 2016-07-06 ENCOUNTER — Ambulatory Visit: Payer: Medicare Other | Admitting: Vascular Surgery

## 2016-09-15 ENCOUNTER — Encounter: Payer: Self-pay | Admitting: Vascular Surgery

## 2016-09-21 ENCOUNTER — Ambulatory Visit (HOSPITAL_COMMUNITY)
Admission: RE | Admit: 2016-09-21 | Discharge: 2016-09-21 | Disposition: A | Discharge status: Home / Self Care | Payer: Medicare Other | Source: Ambulatory Visit | Attending: Vascular Surgery | Admitting: Vascular Surgery

## 2016-09-21 ENCOUNTER — Ambulatory Visit (INDEPENDENT_AMBULATORY_CARE_PROVIDER_SITE_OTHER): Payer: Medicare Other | Admitting: Vascular Surgery

## 2016-09-21 ENCOUNTER — Encounter: Payer: Self-pay | Admitting: Vascular Surgery

## 2016-09-21 VITALS — BP 146/88 | HR 69 | Temp 97.2°F | Resp 16 | Ht 68.0 in | Wt 179.0 lb

## 2016-09-21 DIAGNOSIS — I739 Peripheral vascular disease, unspecified: Secondary | ICD-10-CM

## 2016-09-21 DIAGNOSIS — I743 Embolism and thrombosis of arteries of the lower extremities: Secondary | ICD-10-CM | POA: Insufficient documentation

## 2016-09-21 DIAGNOSIS — I7409 Other arterial embolism and thrombosis of abdominal aorta: Secondary | ICD-10-CM | POA: Diagnosis not present

## 2016-09-21 NOTE — Progress Notes (Signed)
HISTORY AND PHYSICAL     CC:  Follow up aortobifemoral bypass 09/01/15 Requesting Provider:  Larina Earthly, MD  HPI: This is a 66 y.o. male who is s/p aortobifemoral bypass grafting 09/01/15 by Dr. Arbie Cookey.  He has done quite well and walking without difficulty and denies any claudication sx or non healing wounds.  He states that his left foot still feels a little numb like he has a sock on the left foot, but this is unchanged from when he had his surgery.    He continues to smoke ~ 1ppd.    He does take a daily aspirin and is on a statin for cholesterol management.    Past Medical History:  Diagnosis Date  . Anxiety    history of panic attacks  . Coronary artery disease    2005 3 cornary stents  followed by [facility]  Dr. Dulce Sellar  . GERD (gastroesophageal reflux disease)    OTC tums if needed  . Neuromuscular disorder (HCC)    numbness in left leg  due to circulation problems  . Peripheral arterial disease [facility])     Past Surgical History:  Procedure Laterality Date  . AORTA - BILATERAL FEMORAL ARTERY BYPASS GRAFT Bilateral 09/01/2015   Procedure: AORTOBIFEMORAL BYPASS GRAFT;  Surgeon: Larina Earthly, MD;  Location: [facility] OR;  Service: Vascular;  Laterality: Bilateral;  . APPENDECTOMY    . CORONARY ANGIOPLASTY WITH STENT PLACEMENT      Allergies  Allergen Reactions  . Codeine Other (See Comments)    "makes me feel crazy"    Current Outpatient Prescriptions  Medication Sig Dispense Refill  . aspirin 81 MG tablet Take 81 mg by mouth daily.    Marland Kitchen atorvastatin (LIPITOR) 10 MG tablet TAKE 1 TABLET BY MOUTH DAILY 30 tablet 2   No current facility-administered medications for this visit.     Family History  Problem Relation Age of Onset  . Heart disease Mother   . Heart disease Father     Social History   Social History  . Marital status: Single    Spouse name: N/A  . Number of children: N/A  . Years of education: N/A   Occupational History  . Not on file.    Social History Main Topics  . Smoking status: Current Every Day Smoker    Packs/day: 1.00    Years: 40.00    Types: Cigarettes  . Smokeless tobacco: Never Used  . Alcohol use No  . Drug use: No  . Sexual activity: Not on file   Other Topics Concern  . Not on file   Social History Narrative  . No narrative on file     REVIEW OF SYSTEMS:   [X]  denotes positive finding, [ ]  denotes negative finding Cardiac  Comments:  Chest pain or chest pressure:    Shortness of breath upon exertion:    Short of breath when lying flat:    Irregular heart rhythm:        Vascular    Pain in calf, thigh, or hip brought on by ambulation:    Pain in feet at night that wakes you up from your sleep:     Blood clot in your veins:    Leg swelling:         Pulmonary    Oxygen at home:    Productive cough:     Wheezing:         Neurologic    Sudden weakness in arms or legs:  Sudden numbness in arms or legs:     Sudden onset of difficulty speaking or slurred speech:    Temporary loss of vision in one eye:     Problems with dizziness:         Gastrointestinal    Blood in stool:     Vomited blood:         Genitourinary    Burning when urinating:     Blood in urine:        Psychiatric    Major depression:         Hematologic    Bleeding problems:    Problems with blood clotting too easily:        Skin    Rashes or ulcers:        Constitutional    Fever or chills:      PHYSICAL EXAMINATION:  Vitals:   09/21/16 1354  BP: (!) 146/88  Pulse: 69  Resp: 16  Temp: 97.2 F (36.2 C)   Body mass index is 27.22 kg/m.  General:  WDWN in NAD; vital signs documented above Gait: Not observed HENT: WNL, normocephalic Pulmonary: normal non-labored breathing , without Rales, rhonchi,  wheezing Cardiac: regular HR, without  Murmurs, rubs or gallops; without carotid bruits Abdomen: soft, NT, no masses Skin: without rashes; well healed laparotomy scar and bilateral  groins Vascular Exam/Pulses:  Right Left  Radial 2+ (normal) 2+ (normal)  Femoral 3+ (hyperdynamic) 3+ (hyperdynamic)  Popliteal Unable to palpate  Unable to palpate   DP Unable to palpate  Unable to palpate   PT 2+ (normal) Unable to palpate    Extremities: without ischemic changes, without Gangrene , without cellulitis; without open wounds;  Musculoskeletal: no muscle wasting or atrophy  Neurologic: A&O X 3;  No focal weakness or paresthesias are detected Psychiatric:  The pt has Normal affect.   Non-Invasive Vascular Imaging:   Lower extremity arterial duplex 09/21/16: -Velocities in the right deep femoral artery consistent with 50-74% stenosis -elevated velocities in the left deep femoral and the mid segment SFA consistent with 1-39% stenosis  Previous ABI's on 12/28/15: Right:  1.01 Left:  0.25  Pt meds includes: Statin:  Yes.   Beta Blocker:  No. Aspirin:  Yes.   ACEI:  No. ARB:  No. CCB use:  No Other Antiplatelet/Anticoagulant:  No    ASSESSMENT/PLAN:: 66 y.o. male s/p aortobifemoral bypass grafting on 09/01/15 by Dr. Arbie CookeyEarly   -pt continues to do well since surgery.  He is ambulating without difficulty and denies any claudication symptoms. He does not have any non healing wounds. -he does have easily palpable bilateral femoral pulses and an easily palpable right PT pulse.  Unable to feel his left pedal pulses.  Dr. Arbie CookeyEarly reviewed labs today and discussed with the pt.  He will f/u with us prn if he develolps claudication or if he has non healing wounds, he is to contact us immediately, otherwise, he will f/u as needed.   -discussed smoking cessation with pt and importance.  -he will continue walking regimen   Doreatha MassedSamantha Madonna Flegal, PA-C [facility] [phone]  Clinic MD:  Pt seen and examined with Dr. Arbie CookeyEarly  I have examined the patient, reviewed and agree with above.  Gretta BeganEarly, Todd, MD 09/21/2016 2:49 PM

## 2017-01-10 DEATH — deceased

## 2017-10-25 IMAGING — CR DG CHEST 1V PORT
1 series · 1 of 1 positions shown · non-contrast
Comparison: None.

CLINICAL DATA: Status post aortobifemoral bypass surgery

EXAM:
PORTABLE CHEST 1 VIEW

[AP]
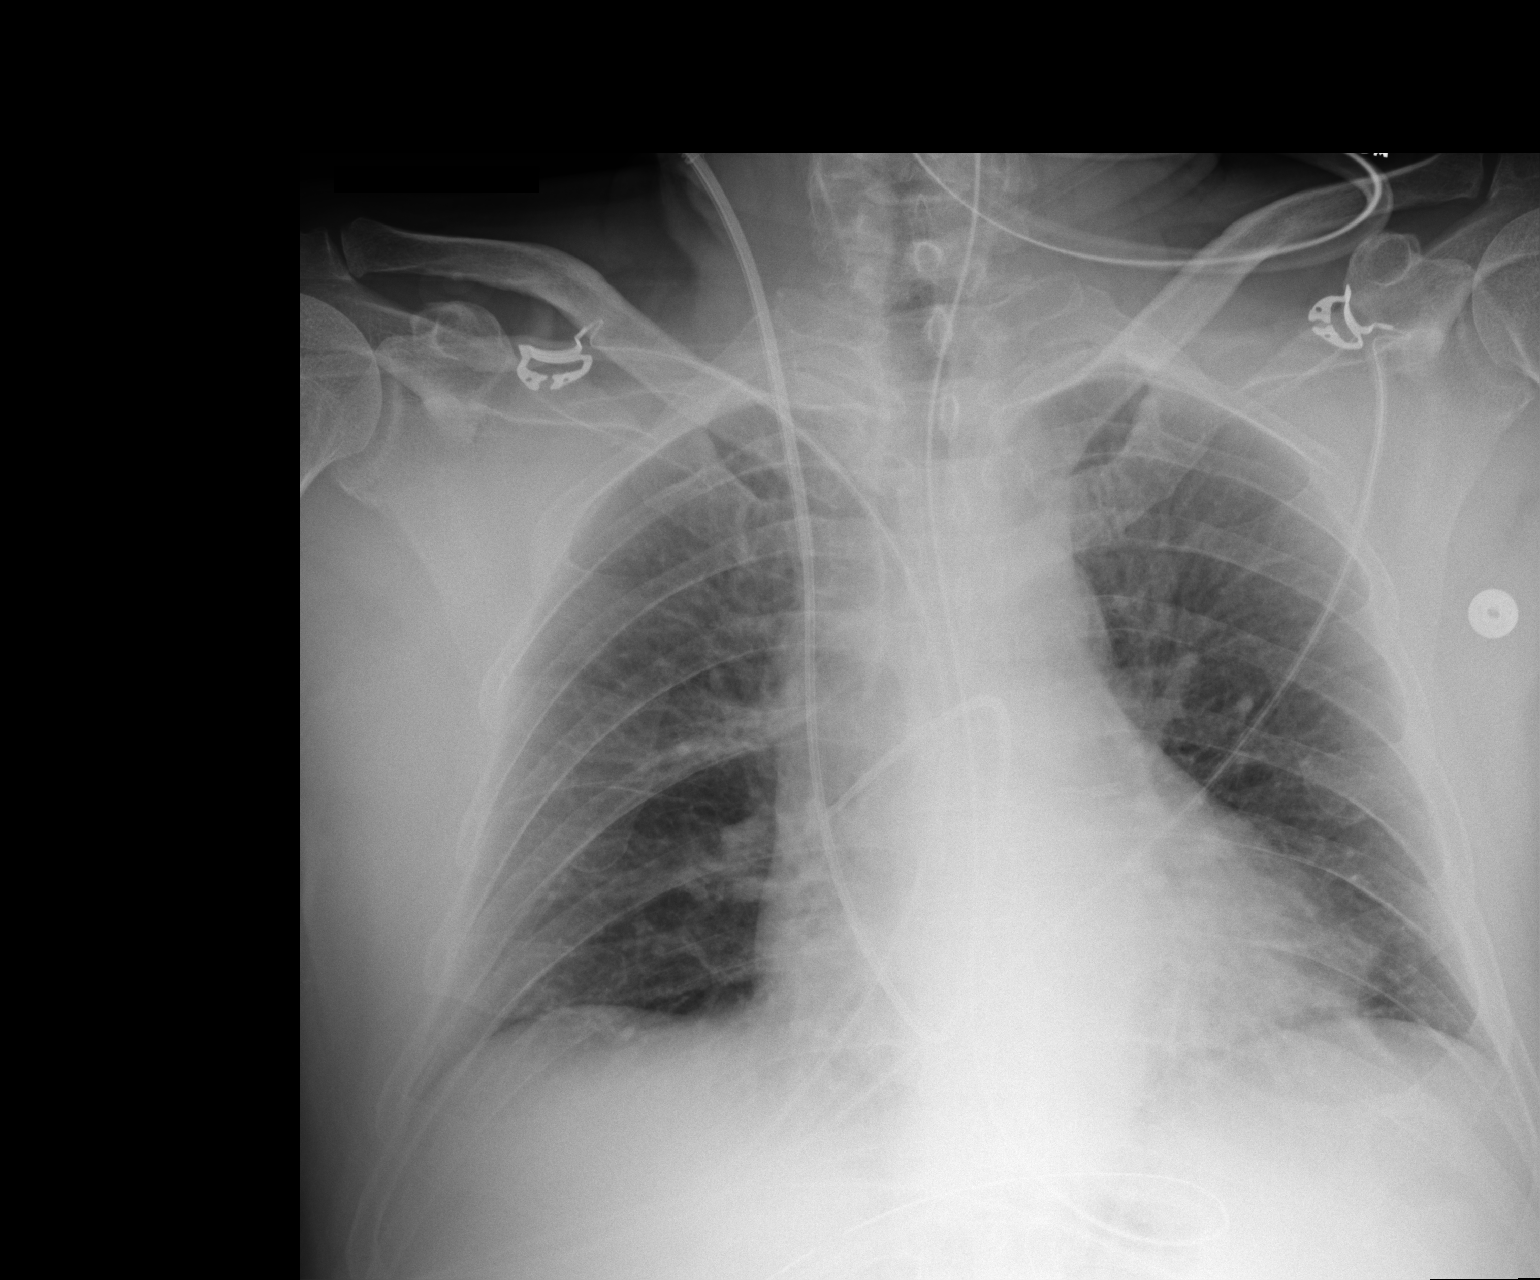

[1 of 1 positions shown; findings below may reference images not displayed]

FINDINGS: Cardiac shadow is mildly prominent accentuated by the portable
technique. A nasogastric catheter and Swan-Ganz catheter are noted
in satisfactory position. The lungs are hypo aerated but no focal
infiltrate is seen. No pneumothorax is noted. No bony abnormality is
seen.
IMPRESSION: Poor inspiratory effort.

Tubes and lines as described.

## 2017-10-25 IMAGING — CR DG ABD PORTABLE 1V
1 series · 1 of 1 positions shown · non-contrast
Comparison: None.

CLINICAL DATA: Status post aortobifemoral bypass surgery.

EXAM:
PORTABLE ABDOMEN - 1 VIEW

[AP]
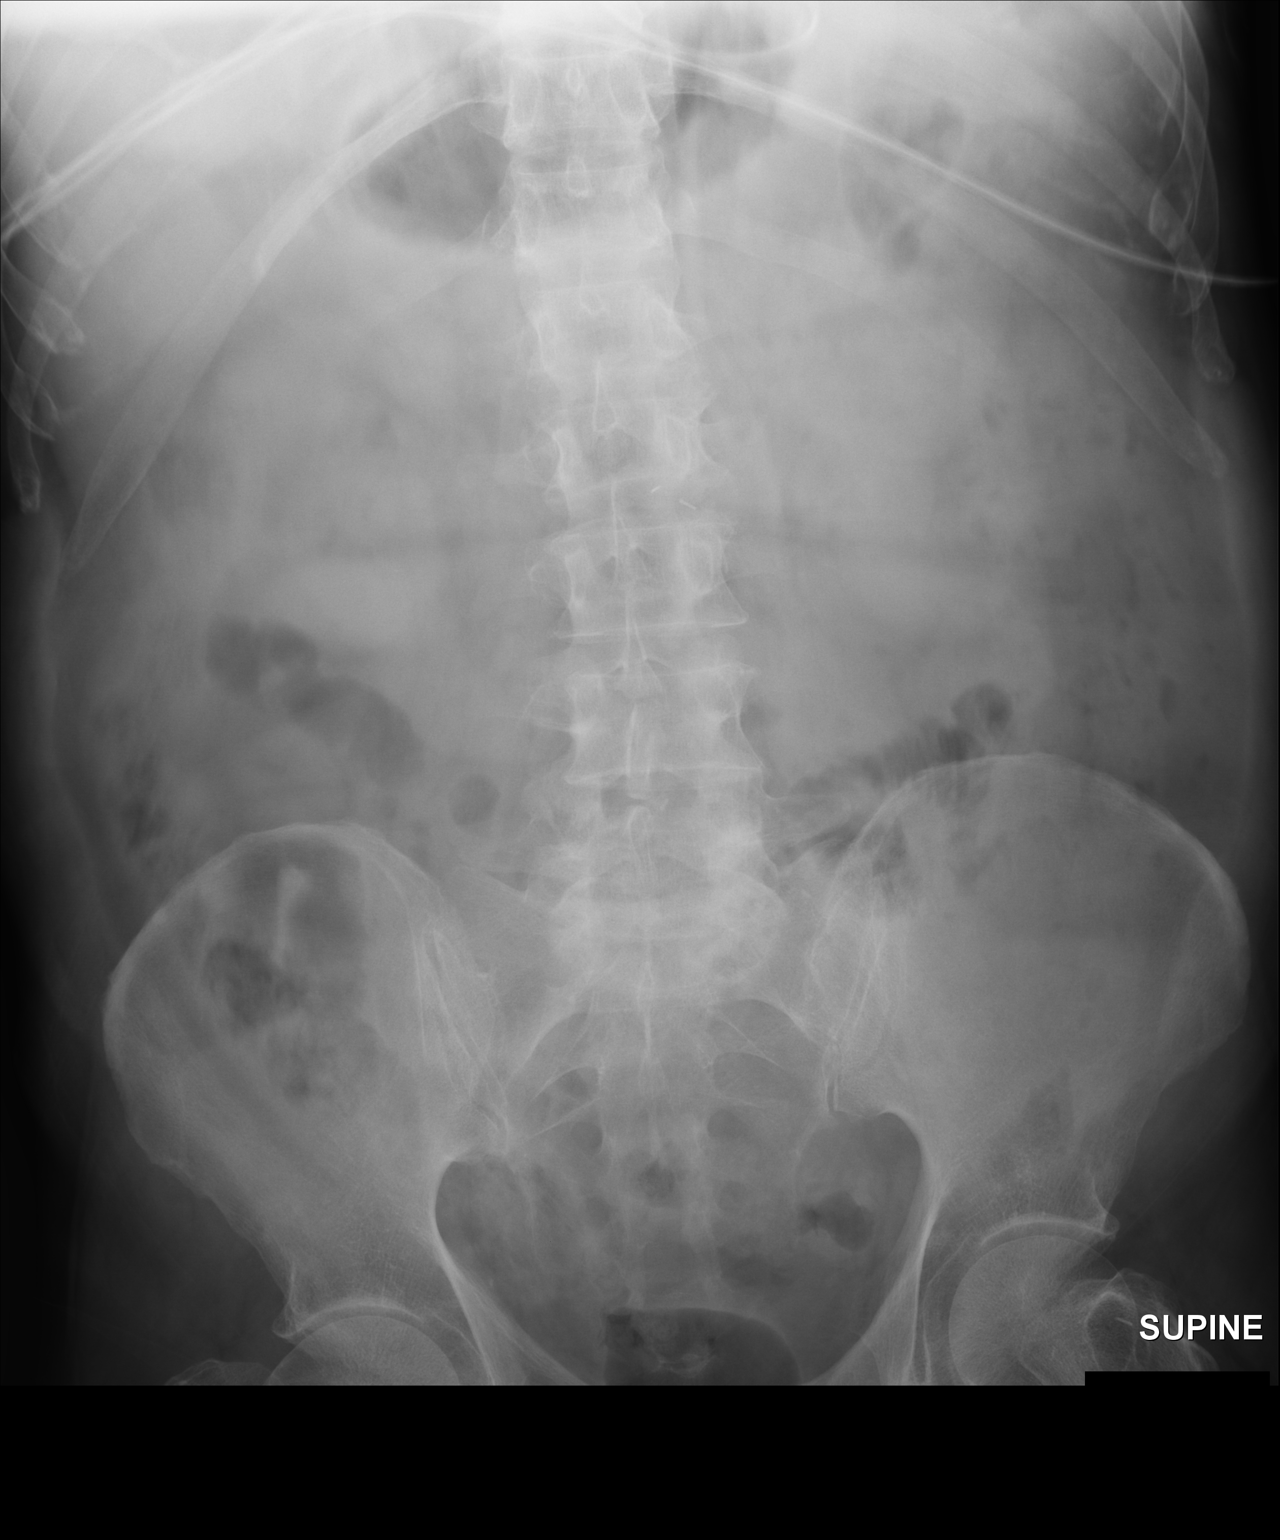

[1 of 1 positions shown; findings below may reference images not displayed]

FINDINGS: The bowel gas pattern is normal. No radio-opaque calculi or other
significant radiographic abnormality are seen. Surgical clips are
seen within mid abdomen.
IMPRESSION: Nonobstructive bowel gas pattern.
